# Patient Record
Sex: Female | Born: 1965
Health system: Southern US, Community
[De-identification: ages and names within clinical notes are randomized; demographics above are authoritative.]

## PROBLEM LIST (undated history)

## (undated) DIAGNOSIS — J45909 Unspecified asthma, uncomplicated: Secondary | ICD-10-CM

## (undated) DIAGNOSIS — E785 Hyperlipidemia, unspecified: Secondary | ICD-10-CM

## (undated) HISTORY — DX: Hyperlipidemia, unspecified: E78.5

## (undated) HISTORY — DX: Unspecified asthma, uncomplicated: J45.909

## (undated) HISTORY — PX: TONSILLECTOMY: SUR1361

## (undated) HISTORY — PX: CHOLECYSTECTOMY: SHX55

---

## 2015-07-20 ENCOUNTER — Other Ambulatory Visit: Payer: Self-pay | Admitting: Medical

## 2015-07-20 DIAGNOSIS — M7121 Synovial cyst of popliteal space [Baker], right knee: Secondary | ICD-10-CM

## 2015-07-23 ENCOUNTER — Ambulatory Visit
Admission: RE | Admit: 2015-07-23 | Discharge: 2015-07-23 | Disposition: A | Discharge status: Home / Self Care | Payer: BLUE CROSS/BLUE SHIELD | Source: Ambulatory Visit | Attending: Medical | Admitting: Medical

## 2015-07-23 DIAGNOSIS — M7989 Other specified soft tissue disorders: Secondary | ICD-10-CM | POA: Diagnosis not present

## 2015-07-23 DIAGNOSIS — M7121 Synovial cyst of popliteal space [Baker], right knee: Secondary | ICD-10-CM

## 2015-07-23 DIAGNOSIS — M25561 Pain in right knee: Secondary | ICD-10-CM | POA: Diagnosis present

## 2015-07-24 ENCOUNTER — Other Ambulatory Visit: Payer: Self-pay | Admitting: Medical

## 2015-07-24 ENCOUNTER — Ambulatory Visit
Admission: RE | Admit: 2015-07-24 | Discharge: 2015-07-24 | Disposition: A | Discharge status: Home / Self Care | Payer: BLUE CROSS/BLUE SHIELD | Source: Ambulatory Visit | Attending: Medical | Admitting: Medical

## 2015-07-24 DIAGNOSIS — M25561 Pain in right knee: Secondary | ICD-10-CM | POA: Insufficient documentation

## 2015-07-24 DIAGNOSIS — M1711 Unilateral primary osteoarthritis, right knee: Secondary | ICD-10-CM | POA: Diagnosis not present

## 2017-01-01 ENCOUNTER — Encounter: Payer: Self-pay | Admitting: Medical

## 2017-01-01 ENCOUNTER — Ambulatory Visit: Payer: Self-pay | Admitting: Medical

## 2017-01-01 VITALS — BP 128/76 | HR 101 | Temp 98.2°F | Resp 18 | Ht 70.0 in | Wt 301.0 lb

## 2017-01-01 DIAGNOSIS — J019 Acute sinusitis, unspecified: Secondary | ICD-10-CM

## 2017-01-01 DIAGNOSIS — J029 Acute pharyngitis, unspecified: Secondary | ICD-10-CM

## 2017-01-01 MED ORDER — AMOXICILLIN-POT CLAVULANATE 875-125 MG PO TABS: 1.0000 | ORAL_TABLET | Freq: Two times a day (BID) | ORAL | 0 refills | Status: DC

## 2017-01-01 NOTE — Patient Instructions (Addendum)
Sinusitis, Adult Sinusitis is soreness and inflammation of your sinuses. Sinuses are hollow spaces in the bones around your face. They are located:  Around your eyes.  In the middle of your forehead.  Behind your nose.  In your cheekbones.  Your sinuses and nasal passages are lined with a stringy fluid (mucus). Mucus normally drains out of your sinuses. When your nasal tissues get inflamed or swollen, the mucus can get trapped or blocked so air cannot flow through your sinuses. This lets bacteria, viruses, and funguses grow, and that leads to infection. Follow these instructions at home: Medicines  Take, use, or apply over-the-counter and prescription medicines only as told by your doctor. These may include nasal sprays.  If you were prescribed an antibiotic medicine, take it as told by your doctor. Do not stop taking the antibiotic even if you start to feel better. Hydrate and Humidify  Drink enough water to keep your pee (urine) clear or pale yellow.  Use a cool mist humidifier to keep the humidity level in your home above 50%.  Breathe in steam for 10-15 minutes, 3-4 times a day or as told by your doctor. You can do this in the bathroom while a hot shower is running.  Try not to spend time in cool or dry air. Rest  Rest as much as possible.  Sleep with your head raised (elevated).  Make sure to get enough sleep each night. General instructions  Put a warm, moist washcloth on your face 3-4 times a day or as told by your doctor. This will help with discomfort.  Wash your hands often with soap and water. If there is no soap and water, use hand sanitizer.  Do not smoke. Avoid being around people who are smoking (secondhand smoke).  Keep all follow-up visits as told by your doctor. This is important. Contact a doctor if:  You have a fever.  Your symptoms get worse.  Your symptoms do not get better within 10 days. Get help right away if:  You have a very bad  headache.  You cannot stop throwing up (vomiting).  You have pain or swelling around your face or eyes.  You have trouble seeing.  You feel confused.  Your neck is stiff.  You have trouble breathing. This information is not intended to replace advice given to you by your health care provider. Make sure you discuss any questions you have with your health care provider. Document Released: 12/10/2007 Document Revised: 02/17/2016 Document Reviewed: 04/18/2015 Elsevier Interactive Patient Education  2018 [employer]. Allergic Rhinitis Allergic rhinitis is when the mucous membranes in the nose respond to allergens. Allergens are particles in the air that cause your body to have an allergic reaction. This causes you to release allergic antibodies. Through a chain of events, these eventually cause you to release histamine into the blood stream. Although meant to protect the body, it is this release of histamine that causes your discomfort, such as frequent sneezing, congestion, and an itchy, runny nose. What are the causes? Seasonal allergic rhinitis (hay fever) is caused by pollen allergens that may come from grasses, trees, and weeds. Year-round allergic rhinitis (perennial allergic rhinitis) is caused by allergens such as house dust mites, pet dander, and mold spores. What are the signs or symptoms?  Nasal stuffiness (congestion).  Itchy, runny nose with sneezing and tearing of the eyes. How is this diagnosed? Your health care provider can help you determine the allergen or allergens that trigger your symptoms. If  you and your health care provider are unable to determine the allergen, skin or blood testing may be used. Your health care provider will diagnose your condition after taking your health history and performing a physical exam. Your health care provider may assess you for other related conditions, such as asthma, pink eye, or an ear infection. How is this treated? Allergic rhinitis  does not have a cure, but it can be controlled by:  Medicines that block allergy symptoms. These may include allergy shots, nasal sprays, and oral antihistamines.  Avoiding the allergen.  Hay fever may often be treated with antihistamines in pill or nasal spray forms. Antihistamines block the effects of histamine. There are over-the-counter medicines that may help with nasal congestion and swelling around the eyes. Check with your health care provider before taking or giving this medicine. If avoiding the allergen or the medicine prescribed do not work, there are many new medicines your health care provider can prescribe. Stronger medicine may be used if initial measures are ineffective. Desensitizing injections can be used if medicine and avoidance does not work. Desensitization is when a patient is given ongoing shots until the body becomes less sensitive to the allergen. Make sure you follow up with your health care provider if problems continue. Follow these instructions at home: It is not possible to completely avoid allergens, but you can reduce your symptoms by taking steps to limit your exposure to them. It helps to know exactly what you are allergic to so that you can avoid your specific triggers. Contact a health care provider if:  You have a fever.  You develop a cough that does not stop easily (persistent).  You have shortness of breath.  You start wheezing.  Symptoms interfere with normal daily activities. This information is not intended to replace advice given to you by your health care provider. Make sure you discuss any questions you have with your health care provider. Document Released: 03/18/2001 Document Revised: 02/22/2016 Document Reviewed: 02/28/2013 Elsevier Interactive Patient Education  2017 [employer]

## 2017-01-01 NOTE — Progress Notes (Addendum)
Patient calls about  2:30 pm c/o, pharyngitis that continues since last week.  Not worse nor better on antibiotics.  Would like to try some Prednisone to see if pharyngitis will decrease. Felt fatigued all weekend and spent most of the weekend in bed. Prefers Development worker, community. E prescribed  Prednisone 10mg  take  6 tablets by mouth today then 5 tablets tomorrow. Then one tablet less each day thereafter. To take with food.  #21 no refills.  Return to clinic in  3-5 days if not improving. Tried to send to PCP but unable to do so per in basket message.  Subjective:    Patient ID: Debra Gonzalez, female    DOB: [birth year], 51 y.o.   MRN: [id number]  HPI  Patient is a 51 year old  female who reports complaint of sore throat last two weeks sore throat. Increased fatigue.Denies difficulty swallowing. Denies fever, chills, nausea or vomiting. Denies any recent exposures. Denies any difficulty swallowing.  Taking Motrin over the counter as well as Catering manager. Chloraseptic spray. History of Asthma. She does report increased fatigue and sinus pressure for past 8 days. History of Sinusitis(. Denies any recent antibiotics.     Review of Systems  Constitutional: Positive for fatigue. Negative for appetite change, chills and fever.  HENT: Positive for postnasal drip, sinus pressure and sore throat. Negative for congestion, drooling, ear discharge, ear pain, sinus pain, tinnitus and trouble swallowing.   Eyes: Positive for itching. Negative for pain, discharge and redness.  Respiratory: Positive for shortness of breath (with walking outside/ inhaler improves- relieves). Negative for cough, chest tightness and wheezing.   Cardiovascular: Negative for chest pain, palpitations and leg swelling.  Gastrointestinal: Negative for abdominal pain, constipation, diarrhea and nausea.  Endocrine: Negative for polydipsia, polyphagia and polyuria.  Genitourinary: Negative for difficulty urinating and dysuria.   Musculoskeletal: Negative for arthralgias and myalgias (mild x 3 days ).  Skin: Negative for rash and wound.  Neurological: Positive for headaches (6/10 intermittent no more than one a day and is per patinet "sinus like pressure " ). Negative for dizziness, light-headedness and numbness.  Hematological: Negative for adenopathy. Does not bruise/bleed easily.  Psychiatric/Behavioral: Negative for agitation and confusion.       Objective:   Physical Exam  Constitutional: She is oriented to person, place, and time. She appears well-developed and well-nourished.  Non-toxic appearance. She does not have a sickly appearance. She does not appear ill. No distress.  HENT:  Head: Normocephalic and atraumatic.  Right Ear: Hearing, tympanic membrane, external ear and ear canal normal.  Left Ear: Hearing, tympanic membrane, external ear and ear canal normal.  Nose: Mucosal edema (Post nasal drip visualized ) and rhinorrhea present. Right sinus exhibits maxillary sinus tenderness. Right sinus exhibits no frontal sinus tenderness. Left sinus exhibits maxillary sinus tenderness. Left sinus exhibits no frontal sinus tenderness.  Mouth/Throat: Uvula is midline and mucous membranes are normal. Posterior oropharyngeal erythema present. No oropharyngeal exudate, posterior oropharyngeal edema or tonsillar abscesses.  Maxillary tenderness with palpation.   Eyes: Conjunctivae, EOM and lids are normal. Pupils are equal, round, and reactive to light.  Neck: Trachea normal, normal range of motion and full passive range of motion without pain. Neck supple. Normal carotid pulses and no JVD present. Carotid bruit is not present. No neck rigidity.  Cardiovascular: Normal rate, regular rhythm, S1 normal, S2 normal and normal heart sounds.   Pulmonary/Chest: Effort normal and breath sounds normal.  Lymphadenopathy:       Head (right  side): Submental adenopathy present. No submandibular, no tonsillar, no preauricular, no  posterior auricular and no occipital adenopathy present.       Head (left side): Submental adenopathy present. No submandibular, no tonsillar, no preauricular, no posterior auricular and no occipital adenopathy present.    She has no cervical adenopathy.  Neurological: She is alert and oriented to person, place, and time.  Sitting upright on exam table, engaging in conversation appropriately.  Skin: Skin is warm, dry and intact. She is not diaphoretic.  Psychiatric: She has a normal mood and affect. Her speech is normal and behavior is normal. Judgment and thought content normal. Cognition and memory are normal.  Nursing note and vitals reviewed.         Assessment & Plan:  1. Sinusitis - Augmentin prescribed as below.  Return to clinic if no improvement within 72 hours and return to clinic or urgent care if symptoms change or  worsen at anytime.   Discussed over the counter nasal saline rinse and can take ibuprofen per packaged instructions.   Meds ordered this encounter  Medications  . cetirizine (ZYRTEC) 10 MG tablet    Sig: Take 10 mg by mouth daily.  Marland Kitchen. albuterol (PROVENTIL HFA;VENTOLIN HFA) 108 (90 Base) MCG/ACT inhaler    Sig: Inhale 2 puffs into the lungs every 6 (six) hours as needed for wheezing or shortness of breath.  Marland Kitchen. amoxicillin-clavulanate (AUGMENTIN) 875-125 MG tablet    Sig: Take 1 tablet by mouth 2 (two) times daily.    Dispense:  20 tablet    Refill:  0   Visit made with Allean FoundHeather Ratcliff PA

## 2017-01-05 MED ORDER — PREDNISONE 10 MG (21) PO TBPK: ORAL_TABLET | ORAL | 0 refills | Status: DC

## 2017-01-05 NOTE — Addendum Note (Signed)
Addended by: Lavaun Greenfield, Herbert SetaHEATHER R on: 01/05/2017 02:49 PM   Modules accepted: Orders

## 2017-10-05 ENCOUNTER — Ambulatory Visit: Payer: Self-pay | Admitting: Medical

## 2017-10-05 VITALS — BP 137/69 | HR 87 | Temp 98.9°F | Resp 18 | Ht 70.0 in | Wt 307.8 lb

## 2017-10-05 DIAGNOSIS — T63301A Toxic effect of unspecified spider venom, accidental (unintentional), initial encounter: Secondary | ICD-10-CM

## 2017-10-05 MED ORDER — CEPHALEXIN 500 MG PO CAPS: 500.0000 mg | ORAL_CAPSULE | Freq: Three times a day (TID) | ORAL | 0 refills | Status: DC

## 2017-10-05 NOTE — Progress Notes (Signed)
   Subjective:    Patient ID: Debra Gonzalez, female    DOB: Dec 27, 1965, [age] y.o.   MRN: [id number]  HPI  52 yo in non acute distress. Thinks she got bit by a spider on Friday on left anterrior lower leg.Manson Passey.  Brown spider the size of a  nickle and furry.Itching occasionally , takes Zyrtec and phenylephrine once a day for allergy symptoms.  Review of Systems  Constitutional: Positive for chills. Negative for fever.  HENT: Positive for congestion and sore throat (feels full). Negative for ear pain.   Eyes: Negative for discharge and itching.  Respiratory: Negative for cough and shortness of breath.   Cardiovascular: Negative for chest pain.  Gastrointestinal: Positive for abdominal pain (with diarrhea resolved after diarrhea ), diarrhea and nausea. Negative for vomiting.  Genitourinary: Negative for dysuria.  Musculoskeletal: Positive for arthralgias (knee and elbows) and myalgias.  Skin: Positive for color change (redness circular about 1/2 cm, no discharged you itching it Saturday morning with clear fluid.).  Allergic/Immunologic: Positive for environmental allergies and food allergies (spiniach and too much cinnamon).  Neurological: Negative for dizziness, syncope and light-headedness.  Hematological: Negative for adenopathy.  Psychiatric/Behavioral: Positive for decreased concentration. Negative for behavioral problems, self-injury and suicidal ideas.  ate a chicken sandwich at lung then went to the bathroom with diarrhea 2 times, has been  2 times a day.     Objective:   Physical Exam  Constitutional: She is oriented to person, place, and time. She appears well-developed and well-nourished.  HENT:  Head: Normocephalic and atraumatic.  Eyes: Pupils are equal, round, and reactive to light. Conjunctivae and EOM are normal.  Neck: Normal range of motion. Neck supple.  Cardiovascular: Normal rate, regular rhythm and normal heart sounds.  Pulmonary/Chest: Effort normal and breath sounds  normal.  Musculoskeletal: Normal range of motion. She exhibits no edema, tenderness or deformity.  Neurological: She is alert and oriented to person, place, and time.  Skin: Skin is warm and dry. There is erythema (1/2 cm  left lower leg anteriorly).  Nursing note and vitals reviewed.    No inguinal adenopathy. Circular mild erythema on left lower leg anteriorly, no discharge.     Assessment & Plan:  Spider bite skin infection. Clean twice daily with dilute soap and water and neosporin to the site and bandage. Dressed wound for patient today. If pain OTC Motrin or Tylenlol take as directed.  Return in 2 days if not improving. Meds ordered this encounter  Medications  . cephALEXin (KEFLEX) 500 MG capsule    Sig: Take 1 capsule (500 mg total) by mouth 3 (three) times daily.    Dispense:  21 capsule    Refill:  0  patient verbalizes understanding and has no questions at discharge.

## 2017-10-05 NOTE — Patient Instructions (Signed)
Clean twice daily with dilute soap and water and neosporin to the site and a bandage to the area.    Spider Bite Spider bites are not common. Most spider bites do not cause serious problems. There are only a few types of spider bites that can cause serious health problems. Follow these instructions at home: Medicine  Take or apply over-the-counter and prescription medicines only as told by your doctor.  If you were given an antibiotic medicine, take or apply it as told by your doctor. Do not stop using the antibiotic even if your condition improves. General instructions  Do not scratch the bite area.  Keep the bite area clean and dry. Wash the bite area with soap and water every day as told by your doctor.  If directed, apply ice to the bite area. ? Put ice in a plastic bag. ? Place a towel between your skin and the bag. ? Leave the ice on for 20 minutes, 2-3 times per day.  Raise (elevate) the affected area above the level of your heart while you are sitting or lying down, if this is possible.  Keep all follow-up visits as told by your doctor. This is important. Contact a doctor if:  Your bite does not get better after 3 days.  Your bite turns black or purple.  Near the bite, you have: ? Redness. ? Swelling (inflammation). ? Pain that is getting worse. Get help right away if:  You get shortness of breath or chest pain.  You have fluid, blood, or pus coming from the bite area.  You have muscle cramps or painful muscle spasms.  You have stomach (abdominal) pain.  You feel sick to your stomach (nauseous) or you throw up (vomit).  You feel more tired or sleepy than you normally do. This information is not intended to replace advice given to you by your health care provider. Make sure you discuss any questions you have with your health care provider. Document Released: 07/26/2010 Document Revised: 02/18/2016 Document Reviewed: 11/08/2014 Elsevier Interactive Patient  Education  [employer]2018 [employer] Inc[employer]

## 2018-04-05 ENCOUNTER — Ambulatory Visit: Payer: Self-pay | Admitting: Medical

## 2018-04-05 ENCOUNTER — Encounter: Payer: Self-pay | Admitting: Medical

## 2018-04-05 VITALS — BP 133/86 | HR 82 | Temp 98.5°F | Resp 18 | Wt 297.6 lb

## 2018-04-05 DIAGNOSIS — W57XXXA Bitten or stung by nonvenomous insect and other nonvenomous arthropods, initial encounter: Secondary | ICD-10-CM

## 2018-04-05 DIAGNOSIS — L089 Local infection of the skin and subcutaneous tissue, unspecified: Secondary | ICD-10-CM

## 2018-04-05 MED ORDER — CEPHALEXIN 500 MG PO CAPS: 500.0000 mg | ORAL_CAPSULE | Freq: Three times a day (TID) | ORAL | 0 refills | Status: DC

## 2018-04-05 NOTE — Patient Instructions (Signed)
Insect Bite, Adult An insect bite can make your skin red, itchy, and swollen. Some insects can spread disease to people with a bite. However, most insect bites do not lead to disease, and most are not serious. Follow these instructions at home: Bite area care  Do not scratch the bite area.  Keep the bite area clean and dry.  Wash the bite area every day with soap and water as told by your doctor.  Check the bite area every day for signs of infection. Check for: ? More redness, swelling, or pain. ? Fluid or blood. ? Warmth. ? Pus. Managing pain, itching, and swelling  You may put any of these on the bite area as told by your doctor: ? A baking soda paste. ? Cortisone cream. ? Calamine lotion.  If directed, put ice on the bite area. ? Put ice in a plastic bag. ? Place a towel between your skin and the bag. ? Leave the ice on for 20 minutes, 2-3 times a day. Medicines  Take medicines or put medicines on your skin only as told by your doctor.  If you were prescribed an antibiotic medicine, use it as told by your doctor. Do not stop using the antibiotic even if your condition improves. General instructions  Keep all follow-up visits as told by your doctor. This is important. How is this prevented? To help you have a lower risk of insect bites:  When you are outside, wear clothing that covers your arms and legs.  Use insect repellent. The best insect repellents have: ? An active ingredient of DEET, picaridin, oil of lemon eucalyptus (OLE), or IR3535. ? Higher amounts of DEET or another active ingredient than other repellents have.  If your home windows do not have screens, think about putting some in.  Contact a doctor if:  You have more redness, swelling, or pain in the bite area.  You have fluid, blood, or pus coming from the bite area.  The bite area feels warm.  You have a fever. Get help right away if:  You have joint pain.  You have a rash.  You have  shortness of breath.  You feel more tired or sleepy than you normally do.  You have neck pain.  You have a headache.  You feel weaker than you normally do.  You have chest pain.  You have pain in your belly.  You feel sick to your stomach (nauseous) or you throw up (vomit). Summary  An insect bite can make your skin red, itchy, and swollen.  Do not scratch the bite area, and keep it clean and dry.  Ice can help with pain and itching from the bite. This information is not intended to replace advice given to you by your health care provider. Make sure you discuss any questions you have with your health care provider. Document Released: 06/20/2000 Document Revised: 01/24/2016 Document Reviewed: 11/08/2014 Elsevier Interactive Patient Education  2018 [employer] Inc.  

## 2018-04-05 NOTE — Progress Notes (Signed)
   Subjective:    Patient ID: Debra Gonzalez, female    DOB: 1966/06/05, 52 y.o.   MRN: [id number]  HPI 52 yo female in non acute distress.  Presents with complaints of bug bite, left lower leg on the shin. Noticed area Friday night , itchy but not painful.  Her office staff felt she needed to come in and get checked.  Has washed it with soap and water only. No fever or chills no myalgias. Patient states she had a Pallmetto bug in her house and he went behind a wall and it could of bite her, and she states she walked through the grass the other day and may have gotten bit then, recalls no specific instance.  Blood pressure 133/86, pulse 82, temperature 98.5 F (36.9 C), temperature source Tympanic, resp. rate 18, weight 297 lb 9.6 oz (135 kg), SpO2 99 %. Review of Systems  Constitutional: Negative for chills and fever.  HENT: Positive for congestion (thinks it is allergies) and sore throat. Negative for ear pain.   Eyes: Negative for discharge and itching.  Respiratory: Negative for cough and shortness of breath.   Cardiovascular: Negative for chest pain.  Gastrointestinal: Negative for abdominal pain.  Genitourinary: Negative for dysuria.  Musculoskeletal: Negative for myalgias.  Skin: Negative for rash.  Allergic/Immunologic: Positive for environmental allergies.  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 none today.), syncope and light-headedness.  Hematological: Negative for adenopathy.  Psychiatric/Behavioral: Negative for behavioral problems, self-injury and suicidal ideas.       Objective:   Physical Exam  Constitutional: She is oriented to person, place, and time. She appears well-developed and well-nourished.  HENT:  Head: Normocephalic and atraumatic.  Mouth/Throat: Oropharynx is clear and moist and mucous membranes are normal. Uvula swelling (mild) present. Tonsils are 1+ on the right.  Tonsils are 1+ on the left.  Pulmonary/Chest: No respiratory distress.  Neurological: She is alert and oriented to person, place, and time.  Skin: Skin is warm and dry. Capillary refill takes less than 2 seconds. No rash noted. There is erythema. No pallor.  Psychiatric: She has a normal mood and affect. Her behavior is normal. Judgment and thought content normal.  Nursing note and vitals reviewed.  2+ PT pulse   4cm circular ( area marked for patient to watch) erythema with tiny puncture area where bug bit her located on the left lower shin area. Mild swelling noted to area. No groin adenopathy. Assessment & Plan:  Bug bite Skin infection Meds ordered this encounter  Medications  . cephALEXin (KEFLEX) 500 MG capsule    Sig: Take 1 capsule (500 mg total) by mouth 3 (three) times daily.    Dispense:  30 capsule    Refill:  0  wash BID with dilute soap and water neosporin to site and a dressing to the area. Dressed in clinic with neosporin. If fever , chills or worsening to return to the clinic. Return in 3-5 days if not improving.  Use warm /cool compresses to the site. Take OTC Motrin as needed for pain and swelling , take as directed on the package. Patient verbalizes understanding and has no questions at discharge.

## 2018-04-06 ENCOUNTER — Ambulatory Visit: Payer: Self-pay | Admitting: Medical

## 2018-12-27 ENCOUNTER — Telehealth: Payer: Self-pay | Admitting: *Deleted

## 2018-12-27 NOTE — Telephone Encounter (Signed)
Debra Gonzalez called with c/o "fluid in both ears, makes them feel itchy for about a week and a half". When asked she states she is taking her Zyrtec daily and using Flonase 2 sprays every morning. After consult with H.Ratcliffe PA-C, and confirming pt does not have hypertension, advised pt to take Zyrtec-D for the next 2 weeks to help dry up the fluid in her ears and continue use of Flonase daily. Further advised if symptoms do not improve or get worse to call her PCP. Ms. Debra Gonzalez understanding of topics discussed and has no further needs or concerns at this time.

## 2019-03-16 DIAGNOSIS — D485 Neoplasm of uncertain behavior of skin: Secondary | ICD-10-CM | POA: Diagnosis not present

## 2019-03-16 DIAGNOSIS — B353 Tinea pedis: Secondary | ICD-10-CM | POA: Diagnosis not present

## 2019-03-16 DIAGNOSIS — L821 Other seborrheic keratosis: Secondary | ICD-10-CM | POA: Diagnosis not present

## 2019-04-05 DIAGNOSIS — J060 Acute laryngopharyngitis: Secondary | ICD-10-CM | POA: Diagnosis not present

## 2019-04-14 DIAGNOSIS — Z20828 Contact with and (suspected) exposure to other viral communicable diseases: Secondary | ICD-10-CM | POA: Diagnosis not present

## 2019-05-16 DIAGNOSIS — L814 Other melanin hyperpigmentation: Secondary | ICD-10-CM | POA: Diagnosis not present

## 2019-05-16 DIAGNOSIS — L719 Rosacea, unspecified: Secondary | ICD-10-CM | POA: Diagnosis not present

## 2019-05-16 DIAGNOSIS — L821 Other seborrheic keratosis: Secondary | ICD-10-CM | POA: Diagnosis not present

## 2019-05-16 DIAGNOSIS — D179 Benign lipomatous neoplasm, unspecified: Secondary | ICD-10-CM | POA: Diagnosis not present

## 2019-08-15 DIAGNOSIS — J45901 Unspecified asthma with (acute) exacerbation: Secondary | ICD-10-CM | POA: Diagnosis not present

## 2019-08-15 DIAGNOSIS — J301 Allergic rhinitis due to pollen: Secondary | ICD-10-CM | POA: Diagnosis not present

## 2019-08-15 DIAGNOSIS — J0100 Acute maxillary sinusitis, unspecified: Secondary | ICD-10-CM | POA: Diagnosis not present

## 2019-11-04 DIAGNOSIS — J069 Acute upper respiratory infection, unspecified: Secondary | ICD-10-CM | POA: Diagnosis not present

## 2019-11-04 DIAGNOSIS — Z20822 Contact with and (suspected) exposure to covid-19: Secondary | ICD-10-CM | POA: Diagnosis not present

## 2019-11-04 DIAGNOSIS — J029 Acute pharyngitis, unspecified: Secondary | ICD-10-CM | POA: Diagnosis not present

## 2019-11-07 ENCOUNTER — Other Ambulatory Visit: Payer: Self-pay

## 2019-11-07 ENCOUNTER — Ambulatory Visit
Admission: EM | Admit: 2019-11-07 | Discharge: 2019-11-07 | Disposition: A | Discharge status: Home / Self Care | Payer: BC Managed Care – PPO

## 2019-11-07 DIAGNOSIS — J45901 Unspecified asthma with (acute) exacerbation: Secondary | ICD-10-CM | POA: Diagnosis not present

## 2019-11-07 DIAGNOSIS — Z03818 Encounter for observation for suspected exposure to other biological agents ruled out: Secondary | ICD-10-CM

## 2019-11-07 HISTORY — DX: Unspecified asthma, uncomplicated: J45.909

## 2019-11-07 LAB — POC SARS CORONAVIRUS 2 AG -  ED: SARS Coronavirus 2 Ag: NEGATIVE

## 2019-11-07 MED ORDER — AZITHROMYCIN 250 MG PO TABS: 250.0000 mg | ORAL_TABLET | Freq: Every day | ORAL | 0 refills | Status: DC

## 2019-11-07 MED ORDER — PREDNISONE 10 MG PO TABS
40.0000 mg | ORAL_TABLET | Freq: Every day | ORAL | 0 refills | Status: AC
Start: 2019-11-07 — End: 2019-11-12

## 2019-11-07 NOTE — Discharge Instructions (Signed)
Use your albuterol inhaler as directed.  Take the Zithromax and prednisone as directed.    Follow up with your PCP if your symptoms are not improving.    Your rapid COVID test is negative; the send-out test is pending.  You should self quarantine until your test result is back and is negative.    Go to the emergency department if you develop high fever, shortness of breath, severe diarrhea, or other concerning symptoms.

## 2019-11-07 NOTE — ED Provider Notes (Signed)
Debra Gonzalez    CSN: [id number] Arrival date & time: 11/07/19  1035      History   Chief Complaint Chief Complaint  Patient presents with  . Hoarse    HPI Debra Gonzalez is a 54 y.o. female.   Patient presents with 5-day history of fever, nonproductive cough, nasal congestion, hoarse voice.  T-max 100.8.  She had a sore throat previously but this has resolved.  [facility]; had a negative rapid COVID and rapid strep; she was treated with Tessalon Perles.  Additionally she has been using her albuterol inhaler at home for treatment; she has Reactive Airway Disease.  She denies rash, shortness of breath, vomiting, diarrhea, or other symptoms.  The history is provided by the patient.    Past Medical History:  Diagnosis Date  . Reactive airway disease     There are no problems to display for this patient.   Past Surgical History:  Procedure Laterality Date  . CHOLECYSTECTOMY      OB History   No obstetric history on file.      Home Medications    Prior to Admission medications   Medication Sig Start Date End Date Taking? Authorizing Provider  albuterol (PROVENTIL HFA;VENTOLIN HFA) 108 (90 Base) MCG/ACT inhaler Inhale 2 puffs into the lungs every 6 (six) hours as needed for wheezing or shortness of breath.   Yes [provider]  cetirizine (ZYRTEC) 10 MG tablet Take 10 mg by mouth daily.   Yes [provider]  fluticasone (FLOVENT HFA) 110 MCG/ACT inhaler Inhale into the lungs 2 (two) times daily.   Yes [provider]  azithromycin (ZITHROMAX) 250 MG tablet Take 1 tablet (250 mg total) by mouth daily. Take first 2 tablets together, then 1 every day until finished. 11/07/19   Mickie Bail, NP  cephALEXin (KEFLEX) 500 MG capsule Take 1 capsule (500 mg total) by mouth 3 (three) times daily. 04/05/18   Ratcliffe, Heather R, PA-C  predniSONE (DELTASONE) 10 MG tablet Take 4 tablets (40 mg total) by mouth daily for 5  days. 11/07/19 11/12/19  Mickie Bail, NP    Family History Family History  Problem Relation Age of Onset  . Mitral valve prolapse Mother   . Melanoma Father     Social History Social History   Tobacco Use  . Smoking status: Never Smoker  . Smokeless tobacco: Never Used  Substance Use Topics  . Alcohol use: Yes    Alcohol/week: 1.0 standard drinks    Types: 1 Glasses of wine per week    Comment: weekly  . Drug use: No     Allergies   Sulfa antibiotics, Cinnamon, Codeine, Spinach, and Wheat bran   Review of Systems Review of Systems  Constitutional: Positive for fever. Negative for chills.  HENT: Positive for congestion and sore throat. Negative for ear pain and trouble swallowing.   Eyes: Negative for pain and visual disturbance.  Respiratory: Positive for cough. Negative for shortness of breath.   Cardiovascular: Negative for chest pain and palpitations.  Gastrointestinal: Negative for abdominal pain, diarrhea, nausea and vomiting.  Genitourinary: Negative for dysuria and hematuria.  Musculoskeletal: Negative for arthralgias and back pain.  Skin: Negative for color change and rash.  Neurological: Negative for seizures and syncope.  All other systems reviewed and are negative.    Physical Exam Triage Vital Signs ED Triage Vitals  Enc Vitals Group     BP  Pulse      Resp      Temp      Temp src      SpO2      Weight      Height      Head Circumference      Peak Flow      Pain Score      Pain Loc      Pain Edu?      Excl. in [place]?    No data found.  Updated Vital Signs BP 133/79 (BP Location: Left Arm)   Pulse (!) 114   Temp 99.5 F (37.5 C) (Oral)   Resp 18   Ht 5\' 11"  (1.803 m)   Wt (!) 320 lb (145.2 kg)   LMP  (LMP Unknown)   SpO2 95%   BMI 44.63 kg/m   Visual Acuity Right Eye Distance:   Left Eye Distance:   Bilateral Distance:    Right Eye Near:   Left Eye Near:    Bilateral Near:     Physical Exam Vitals and nursing note  reviewed.  Constitutional:      General: She is not in acute distress.    Appearance: She is well-developed. She is obese.  HENT:     Head: Normocephalic and atraumatic.     Right Ear: Tympanic membrane normal.     Left Ear: Tympanic membrane normal.     Nose: Congestion present.     Mouth/Throat:     Mouth: Mucous membranes are moist.     Pharynx: Oropharynx is clear.  Eyes:     Conjunctiva/sclera: Conjunctivae normal.  Cardiovascular:     Rate and Rhythm: Normal rate and regular rhythm.     Heart sounds: No murmur.  Pulmonary:     Effort: Pulmonary effort is normal. No respiratory distress.     Breath sounds: Wheezing and rhonchi present.     Comments: Few scattered rhonchi and wheezes.  No respiratory distress.  Abdominal:     Palpations: Abdomen is soft.     Tenderness: There is no abdominal tenderness. There is no guarding or rebound.  Musculoskeletal:     Cervical back: Neck supple.  Skin:    General: Skin is warm and dry.     Findings: No rash.  Neurological:     General: No focal deficit present.     Mental Status: She is alert and oriented to person, place, and time.  Psychiatric:        Mood and Affect: Mood normal.        Behavior: Behavior normal.      UC Treatments / Results  Labs (all labs ordered are listed, but only abnormal results are displayed) Labs Reviewed  NOVEL CORONAVIRUS, NAA  POC SARS CORONAVIRUS 2 AG -  ED    EKG   Radiology No results found.  Procedures Procedures (including critical care time)  Medications Ordered in UC Medications - No data to display  Initial Impression / Assessment and Plan / UC Course  I have reviewed the triage vital signs and the nursing notes.  Pertinent labs & imaging results that were available during my care of the patient were reviewed by me and considered in my medical decision making (see chart for details).   Acute exacerbation of reactive airway disease.  Treating with continued use of  albuterol inhaler.  Also Zithromax and prednisone.  Instructed patient to follow-up with her PCP if her symptoms or not improving.  POC COVID negative;  PCR pending.  Instructed patient to self quarantine until the test result is back and to take Tylenol as needed for fever/discomfort.  Instructed patient to go to the emergency department if she develops high fever, shortness of breath, severe diarrhea, or other concerning symptoms.  Patient agrees with plan of care.    Final Clinical Impressions(s) / UC Diagnoses   Final diagnoses:  Reactive airway disease with acute exacerbation, unspecified asthma severity, unspecified whether persistent     Discharge Instructions     Use your albuterol inhaler as directed.  Take the Zithromax and prednisone as directed.    Follow up with your PCP if your symptoms are not improving.    Your rapid COVID test is negative; the send-out test is pending.  You should self quarantine until your test result is back and is negative.    Go to the emergency department if you develop high fever, shortness of breath, severe diarrhea, or other concerning symptoms.        ED Prescriptions    Medication Sig Dispense Auth. Provider   azithromycin (ZITHROMAX) 250 MG tablet Take 1 tablet (250 mg total) by mouth daily. Take first 2 tablets together, then 1 every day until finished. 6 tablet Mickie Bail, NP   predniSONE (DELTASONE) 10 MG tablet Take 4 tablets (40 mg total) by mouth daily for 5 days. 20 tablet Mickie Bail, NP     PDMP not reviewed this encounter.   Mickie Bail, NP 11/07/19 [phone]

## 2019-11-07 NOTE — ED Triage Notes (Addendum)
Patient complains of hoarseness, fever, cough and congestion. Patient states that she was seen on Friday at [facility] in [place]. States that was tested for Covid and Strep and both were negative. Patient states that she has continued to have fevers and feels like this has settled and moved in to her chest. Patient states that she was given Tessalon perles and has showed no improvement.

## 2019-11-09 LAB — NOVEL CORONAVIRUS, NAA: SARS-CoV-2, NAA: NOT DETECTED

## 2019-11-09 LAB — SARS-COV-2, NAA 2 DAY TAT

## 2019-11-14 ENCOUNTER — Telehealth: Payer: Self-pay | Admitting: Family Medicine

## 2019-11-14 NOTE — Telephone Encounter (Signed)
Patient was contacted advised to use otc Flonase and if not any better needs to come back and be evaluated again

## 2019-11-15 ENCOUNTER — Ambulatory Visit
Admission: EM | Admit: 2019-11-15 | Discharge: 2019-11-15 | Disposition: A | Discharge status: Home / Self Care | Payer: BC Managed Care – PPO

## 2019-11-15 ENCOUNTER — Encounter: Payer: Self-pay | Admitting: Emergency Medicine

## 2019-11-15 ENCOUNTER — Other Ambulatory Visit: Payer: Self-pay

## 2019-11-15 DIAGNOSIS — J45901 Unspecified asthma with (acute) exacerbation: Secondary | ICD-10-CM

## 2019-11-15 MED ORDER — PREDNISONE 10 MG PO TABS
40.0000 mg | ORAL_TABLET | Freq: Every day | ORAL | 0 refills | Status: AC
Start: 2019-11-15 — End: 2019-11-20

## 2019-11-15 NOTE — ED Provider Notes (Signed)
[name]    CSN: [id number] Arrival date & time: 11/15/19  5009      History   Chief Complaint Chief Complaint  Patient presents with  . Cough  . Shortness of Breath  . Nasal Congestion    HPI Debra Gonzalez is a 54 y.o. female.   Patient presents with ongoing nonproductive cough and shortness of breath.  She states she has been using her albuterol inhaler every 6 hours and has completed the course of Zithromax and prednisone previously prescribed.  She denies fever, chills, sore throat, vomiting, diarrhea, rash, or other symptoms.  [facility]; treated with Tessalon Perles; COVID and strep negative.  She was seen here on 11/07/2019; treated with Zithromax, prednisone, albuterol inhaler; COVID negative.  She contacted here via telephone yesterday and was instructed to use OTC Flonase or come for in-person evaluation.  The history is provided by the patient.    Past Medical History:  Diagnosis Date  . Reactive airway disease     There are no problems to display for this patient.   Past Surgical History:  Procedure Laterality Date  . CHOLECYSTECTOMY      OB History   No obstetric history on file.      Home Medications    Prior to Admission medications   Medication Sig Start Date End Date Taking? Authorizing Provider  albuterol (PROVENTIL HFA;VENTOLIN HFA) 108 (90 Base) MCG/ACT inhaler Inhale 2 puffs into the lungs every 6 (six) hours as needed for wheezing or shortness of breath.   Yes [provider]  benzonatate (TESSALON) 200 MG capsule Take 1 capsule by mouth at bedtime. 11/04/19  Yes [provider]  cetirizine (ZYRTEC) 10 MG tablet Take 10 mg by mouth daily.   Yes [provider]  fluticasone (FLONASE) 50 MCG/ACT nasal spray Place 2 sprays into both nostrils daily.   Yes [provider]  fluticasone (FLOVENT HFA) 110 MCG/ACT inhaler Inhale into the lungs 2 (two) times daily.   Yes  [provider]  azithromycin (ZITHROMAX) 250 MG tablet Take 1 tablet (250 mg total) by mouth daily. Take first 2 tablets together, then 1 every day until finished. 11/07/19   Mickie Bail, NP  cephALEXin (KEFLEX) 500 MG capsule Take 1 capsule (500 mg total) by mouth 3 (three) times daily. 04/05/18   Ratcliffe, Heather R, PA-C  predniSONE (DELTASONE) 10 MG tablet Take 4 tablets (40 mg total) by mouth daily for 5 days. 11/15/19 11/20/19  Mickie Bail, NP    Family History Family History  Problem Relation Age of Onset  . Mitral valve prolapse Mother   . Melanoma Father     Social History Social History   Tobacco Use  . Smoking status: Never Smoker  . Smokeless tobacco: Never Used  Substance Use Topics  . Alcohol use: Yes    Alcohol/week: 1.0 standard drinks    Types: 1 Glasses of wine per week    Comment: weekly  . Drug use: No     Allergies   Sulfa antibiotics, Cinnamon, Codeine, Spinach, and Wheat bran   Review of Systems Review of Systems  Constitutional: Negative for chills and fever.  HENT: Positive for congestion. Negative for ear pain and sore throat.   Eyes: Negative for pain and visual disturbance.  Respiratory: Positive for cough and shortness of breath.   Cardiovascular: Negative for chest pain and palpitations.  Gastrointestinal: Negative for abdominal pain, diarrhea, nausea and vomiting.  Genitourinary: Negative for dysuria and hematuria.  Musculoskeletal: Negative for arthralgias and back pain.  Skin: Negative for color change and rash.  Neurological: Negative for seizures and syncope.  All other systems reviewed and are negative.    Physical Exam Triage Vital Signs ED Triage Vitals  Enc Vitals Group     BP 11/15/19 0926 136/84     Pulse Rate 11/15/19 0926 98     Resp 11/15/19 0926 18     Temp 11/15/19 0926 99.1 F (37.3 C)     Temp Source 11/15/19 0926 Oral     SpO2 11/15/19 0926 97 %     Weight 11/15/19 0928 (!) 320 lb (145.2 kg)      Height 11/15/19 0928 5' 10.5" (1.791 m)     Head Circumference --      Peak Flow --      Pain Score 11/15/19 0927 7     Pain Loc --      Pain Edu? --      Excl. in [place]? --    No data found.  Updated Vital Signs BP 136/84 (BP Location: Right Arm)   Pulse 98   Temp 99.1 F (37.3 C) (Oral)   Resp 18   Ht 5' 10.5" (1.791 m)   Wt (!) 320 lb (145.2 kg)   LMP  (LMP Unknown)   SpO2 97%   BMI 45.27 kg/m   Visual Acuity Right Eye Distance:   Left Eye Distance:   Bilateral Distance:    Right Eye Near:   Left Eye Near:    Bilateral Near:     Physical Exam Vitals and nursing note reviewed.  Constitutional:      General: She is not in acute distress.    Appearance: She is well-developed. She is not ill-appearing.  HENT:     Head: Normocephalic and atraumatic.     Right Ear: Tympanic membrane normal.     Left Ear: Tympanic membrane normal.     Nose: Nose normal.     Mouth/Throat:     Mouth: Mucous membranes are moist.     Pharynx: Oropharynx is clear.  Eyes:     Conjunctiva/sclera: Conjunctivae normal.  Cardiovascular:     Rate and Rhythm: Normal rate and regular rhythm.     Heart sounds: No murmur.  Pulmonary:     Effort: Pulmonary effort is normal. No respiratory distress.     Breath sounds: Normal breath sounds. No wheezing or rhonchi.     Comments: Dry cough during exam.  No wheezes or rhonchi but breath sounds mildly tight when coughing.  Abdominal:     General: Bowel sounds are normal.     Palpations: Abdomen is soft.     Tenderness: There is no abdominal tenderness. There is no guarding or rebound.  Musculoskeletal:     Cervical back: Neck supple.  Skin:    General: Skin is warm and dry.     Findings: No rash.  Neurological:     General: No focal deficit present.     Mental Status: She is alert and oriented to person, place, and time.  Psychiatric:        Mood and Affect: Mood normal.        Behavior: Behavior normal.      UC Treatments / Results   Labs (all labs ordered are listed, but only abnormal results are displayed) Labs Reviewed - No data to display  EKG   Radiology No results found.  Procedures Procedures (  including critical care time)  Medications Ordered in UC Medications - No data to display  Initial Impression / Assessment and Plan / UC Course  I have reviewed the triage vital signs and the nursing notes.  Pertinent labs & imaging results that were available during my care of the patient were reviewed by me and considered in my medical decision making (see chart for details).   Acute exacerbation of reactive airway disease.  Treating with prednisone.  Instructed patient to continue using her albuterol inhaler.  Instructed her to call her PCP to schedule an appointment for recheck in 1 week.  Instructed her to go to the ED if she has acute shortness of breath or difficulty breathing.  Patient agrees to plan of care.      Final Clinical Impressions(s) / UC Diagnoses   Final diagnoses:  Reactive airway disease with acute exacerbation, unspecified asthma severity, unspecified whether persistent     Discharge Instructions     Continue to use your albuterol inhaler.  Take the prednisone as directed.    Call your primary care provider to schedule an appointment for a recheck in 1 week.    Go to the emergency department if you have acute shortness of breath or difficulty breathing.          ED Prescriptions    Medication Sig Dispense Auth. Provider   predniSONE (DELTASONE) 10 MG tablet Take 4 tablets (40 mg total) by mouth daily for 5 days. 20 tablet Sharion Balloon, NP     PDMP not reviewed this encounter.   Sharion Balloon, NP 11/15/19 1004

## 2019-11-15 NOTE — Discharge Instructions (Signed)
Continue to use your albuterol inhaler.  Take the prednisone as directed.    Call your primary care provider to schedule an appointment for a recheck in 1 week.    Go to the emergency department if you have acute shortness of breath or difficulty breathing.

## 2019-11-15 NOTE — ED Triage Notes (Signed)
Patient in today c/o continued cough, nasal congestion and sob x 10 days. Patient was seen for same on 11/07/19 at [facility].

## 2019-11-24 ENCOUNTER — Emergency Department: Payer: BC Managed Care – PPO

## 2019-11-24 ENCOUNTER — Encounter: Payer: Self-pay | Admitting: *Deleted

## 2019-11-24 ENCOUNTER — Other Ambulatory Visit: Payer: Self-pay

## 2019-11-24 DIAGNOSIS — J4521 Mild intermittent asthma with (acute) exacerbation: Secondary | ICD-10-CM | POA: Diagnosis not present

## 2019-11-24 DIAGNOSIS — R0602 Shortness of breath: Secondary | ICD-10-CM | POA: Diagnosis not present

## 2019-11-24 DIAGNOSIS — Z79899 Other long term (current) drug therapy: Secondary | ICD-10-CM | POA: Diagnosis not present

## 2019-11-24 DIAGNOSIS — R05 Cough: Secondary | ICD-10-CM | POA: Diagnosis not present

## 2019-11-24 LAB — BASIC METABOLIC PANEL
Anion gap: 12 (ref 5–15)
BUN: 14 mg/dL (ref 6–20)
CO2: 22 mmol/L (ref 22–32)
Calcium: 8.8 mg/dL — ABNORMAL LOW (ref 8.9–10.3)
Chloride: 104 mmol/L (ref 98–111)
Creatinine, Ser: 0.75 mg/dL (ref 0.44–1.00)
GFR calc Af Amer: >60 mL/min (ref >60)
GFR calc non Af Amer: >60 mL/min (ref >60)
Glucose, Bld: 116 mg/dL — ABNORMAL HIGH (ref 70–99)
Potassium: 4.1 mmol/L (ref 3.5–5.1)
Sodium: 138 mmol/L (ref 135–145)

## 2019-11-24 LAB — CBC
HCT: 40.7 % (ref 36.0–46.0)
Hemoglobin: 14.2 g/dL (ref 12.0–15.0)
MCH: 32.7 pg (ref 26.0–34.0)
MCHC: 34.9 g/dL (ref 30.0–36.0)
MCV: 93.8 fL (ref 80.0–100.0)
Platelets: 289 10*3/uL (ref 150–400)
RBC: 4.34 MIL/uL (ref 3.87–5.11)
RDW: 12.4 % (ref 11.5–15.5)
WBC: 7.7 10*3/uL (ref 4.0–10.5)
nRBC: 0 % (ref 0.0–0.2)

## 2019-11-24 LAB — TROPONIN I (HIGH SENSITIVITY): Troponin I (High Sensitivity): 3 ng/L (ref <18)

## 2019-11-24 MED ORDER — ALBUTEROL SULFATE (2.5 MG/3ML) 0.083% IN NEBU
INHALATION_SOLUTION | RESPIRATORY_TRACT | Status: AC
  Administered 2019-11-24: 5 mg via RESPIRATORY_TRACT
  Filled 2019-11-24: qty 6

## 2019-11-24 MED ORDER — ALBUTEROL SULFATE (2.5 MG/3ML) 0.083% IN NEBU: 5.0000 mg | INHALATION_SOLUTION | Freq: Once | RESPIRATORY_TRACT | Status: AC

## 2019-11-24 NOTE — ED Triage Notes (Signed)
Pt to ED reporting increased SOB with hx of asthma. Inhaler has given no relief. Pt reporting she has needed to use her inhaler every hour to keep her breathing under control. No fevers. Dry cough noted upon assessment.

## 2019-11-25 ENCOUNTER — Emergency Department
Admission: EM | Admit: 2019-11-25 | Discharge: 2019-11-25 | Disposition: A | Discharge status: Home / Self Care | Payer: BC Managed Care – PPO | Attending: Emergency Medicine | Admitting: Emergency Medicine

## 2019-11-25 DIAGNOSIS — J4521 Mild intermittent asthma with (acute) exacerbation: Secondary | ICD-10-CM

## 2019-11-25 MED ORDER — IPRATROPIUM-ALBUTEROL 0.5-2.5 (3) MG/3ML IN SOLN
3.0000 mL | Freq: Once | RESPIRATORY_TRACT | Status: AC
  Administered 2019-11-25: 3 mL via RESPIRATORY_TRACT
  Filled 2019-11-25: qty 3

## 2019-11-25 MED ORDER — BENZONATATE 100 MG PO CAPS
100.0000 mg | ORAL_CAPSULE | Freq: Three times a day (TID) | ORAL | 0 refills | Status: DC | PRN
Start: 2019-11-25 — End: 2019-12-12

## 2019-11-25 MED ORDER — ALBUTEROL SULFATE (2.5 MG/3ML) 0.083% IN NEBU
2.5000 mg | INHALATION_SOLUTION | Freq: Once | RESPIRATORY_TRACT | Status: AC
  Administered 2019-11-25: 2.5 mg via RESPIRATORY_TRACT

## 2019-11-25 MED ORDER — ALBUTEROL SULFATE (2.5 MG/3ML) 0.083% IN NEBU
INHALATION_SOLUTION | RESPIRATORY_TRACT | Status: AC
  Filled 2019-11-25: qty 3

## 2019-11-25 MED ORDER — PREDNISONE 20 MG PO TABS
60.0000 mg | ORAL_TABLET | Freq: Once | ORAL | Status: AC
  Administered 2019-11-25: 60 mg via ORAL
  Filled 2019-11-25: qty 3

## 2019-11-25 MED ORDER — ALBUTEROL SULFATE (2.5 MG/3ML) 0.083% IN NEBU
2.5000 mg | INHALATION_SOLUTION | RESPIRATORY_TRACT | 12 refills | Status: DC | PRN
Start: 2019-11-25 — End: 2021-06-18

## 2019-11-25 MED ORDER — COMPRESSOR/NEBULIZER MISC: 1.0000 | Freq: Once | 0 refills | Status: AC

## 2019-11-25 MED ORDER — PREDNISONE 20 MG PO TABS: 60.0000 mg | ORAL_TABLET | Freq: Every day | ORAL | 0 refills | Status: AC

## 2019-11-25 NOTE — ED Provider Notes (Signed)
[facility] Emergency Department Provider Note  ____________________________________________   First MD Initiated Contact with Patient 11/25/19 0309     (approximate)  I have reviewed the triage vital signs and the nursing notes.   HISTORY  Chief Complaint Shortness of Breath    HPI Debra Gonzalez is a 54 y.o. female presents emergency department with a history of increasing wheezing and cough which patient states is been occurring for the past 2 to 3 weeks.  Patient states that this coincides with the period of time that her neighbor has been smoking and she believes this may be the irritant causing her asthma attacks.  Patient states her episode today was unrelieved with inhalers at home.  Patient was seen in urgent care twice in the last 3 weeks secondary to the same placed on prednisone both times as well as azithromycin once.  Patient states that she is moving tomorrow to a new location        Past Medical History:  Diagnosis Date  . Reactive airway disease     There are no problems to display for this patient.   Past Surgical History:  Procedure Laterality Date  . CHOLECYSTECTOMY      Prior to Admission medications   Medication Sig Start Date End Date Taking? Authorizing Provider  albuterol (PROVENTIL HFA;VENTOLIN HFA) 108 (90 Base) MCG/ACT inhaler Inhale 2 puffs into the lungs every 6 (six) hours as needed for wheezing or shortness of breath.    [provider]  azithromycin (ZITHROMAX) 250 MG tablet Take 1 tablet (250 mg total) by mouth daily. Take first 2 tablets together, then 1 every day until finished. 11/07/19   Mickie Bail, NP  benzonatate (TESSALON) 200 MG capsule Take 1 capsule by mouth at bedtime. 11/04/19   [provider]  cephALEXin (KEFLEX) 500 MG capsule Take 1 capsule (500 mg total) by mouth 3 (three) times daily. 04/05/18   Ratcliffe, Heather R, PA-C  cetirizine (ZYRTEC) 10 MG tablet Take 10 mg by mouth daily.     [provider]  fluticasone (FLONASE) 50 MCG/ACT nasal spray Place 2 sprays into both nostrils daily.    [provider]  fluticasone (FLOVENT HFA) 110 MCG/ACT inhaler Inhale into the lungs 2 (two) times daily.    [provider]    Allergies Sulfa antibiotics, Cinnamon, Codeine, Spinach, and Wheat bran  Family History  Problem Relation Age of Onset  . Mitral valve prolapse Mother   . Melanoma Father     Social History Social History   Tobacco Use  . Smoking status: Never Smoker  . Smokeless tobacco: Never Used  Substance Use Topics  . Alcohol use: Yes    Alcohol/week: 1.0 standard drinks    Types: 1 Glasses of wine per week    Comment: weekly  . Drug use: No    Review of Systems Constitutional: No fever/chills Eyes: No visual changes. ENT: No sore throat. Cardiovascular: Denies chest pain. Respiratory: Positive for wheezing dyspnea and cough Gastrointestinal: No abdominal pain.  No nausea, no vomiting.  No diarrhea.  No constipation. Genitourinary: Negative for dysuria. Musculoskeletal: Negative for neck pain.  Negative for back pain. Integumentary: Negative for rash. Neurological: Negative for headaches, focal weakness or numbness.   ____________________________________________   PHYSICAL EXAM:  VITAL SIGNS: ED Triage Vitals [11/24/19 2052]  Enc Vitals Group     BP (!) 154/94     Pulse Rate (!) 122     Resp (!) 25  Temp 98.9 F (37.2 C)     Temp Source Oral     SpO2 98 %     Weight (!) 145.2 kg (320 lb)     Height 1.791 m (5' 10.5")     Head Circumference      Peak Flow      Pain Score 0     Pain Loc      Pain Edu?      Excl. in [place]?     Constitutional: Alert and oriented.  Eyes: Conjunctivae are normal.  Head: Atraumatic. Mouth/Throat: Patient is wearing a mask. Neck: No stridor.  No meningeal signs.   Cardiovascular: Normal rate, regular rhythm. Good peripheral circulation. Grossly normal heart  sounds. Respiratory: Normal respiratory effort.  No retractions. Gastrointestinal: Soft and nontender. No distention.  Musculoskeletal: No lower extremity tenderness nor edema. No gross deformities of extremities. Neurologic:  Normal speech and language. No gross focal neurologic deficits are appreciated.  Skin:  Skin is warm, dry and intact. Psychiatric: Mood and affect are normal. Speech and behavior are normal.  ____________________________________________   LABS (all labs ordered are listed, but only abnormal results are displayed)  Labs Reviewed  BASIC METABOLIC PANEL - Abnormal; Notable for the following components:      Result Value   Glucose, Bld 116 (*)    Calcium 8.8 (*)    All other components within normal limits  CBC  TROPONIN I (HIGH SENSITIVITY)  TROPONIN I (HIGH SENSITIVITY)   ____________________________________________  EKG  ED ECG REPORT I, [place] N Rakiya Krawczyk, the attending physician, personally viewed and interpreted this ECG.   Date: 11/24/2019  EKG Time: 8:42 PM  Rate: 120  Rhythm: Sinus tachycardia  Axis: Normal  Intervals: Normal  ST&T Change: None  ____________________________________________  RADIOLOGY I, [place] N Elveta Rape, personally viewed and evaluated these images (plain radiographs) as part of my medical decision making, as well as reviewing the written report by the radiologist.  ED MD interpretation: No active cardiopulmonary disease noted on chest x-ray.  Official radiology report(s): DG Chest 2 View  Result Date: 11/24/2019 CLINICAL DATA:  Shortness of breath EXAM: CHEST - 2 VIEW COMPARISON:  None. FINDINGS: The heart size and mediastinal contours are within normal limits. Both lungs are clear. The visualized skeletal structures are unremarkable. IMPRESSION: No active cardiopulmonary disease. Electronically Signed   By: Zerita Boers M.D.   On: 11/24/2019 21:20      Procedures   ____________________________________________   INITIAL IMPRESSION / MDM / [place] / ED COURSE  As part of my medical decision making, I reviewed the following data within the electronic MEDICAL RECORD NUMBER   54 year old female presented with above-stated history and physical exam secondary to asthma attack.  Patient received 2 albuterol nebulized treatment and subsequently 2 DuoNeb nebulizer treatment as well as 60 of prednisone with complete resolution of symptoms at present.  ____________________________________________  FINAL CLINICAL IMPRESSION(S) / ED DIAGNOSES  Final diagnoses:  Mild intermittent reactive airway disease with acute exacerbation     MEDICATIONS GIVEN DURING THIS VISIT:  Medications  albuterol (PROVENTIL) (2.5 MG/3ML) 0.083% nebulizer solution (has no administration in time range)  albuterol (PROVENTIL) (2.5 MG/3ML) 0.083% nebulizer solution 5 mg (5 mg Nebulization Given 11/24/19 2055)  albuterol (PROVENTIL) (2.5 MG/3ML) 0.083% nebulizer solution 2.5 mg (2.5 mg Nebulization Given 11/25/19 0140)  predniSONE (DELTASONE) tablet 60 mg (60 mg Oral Given 11/25/19 0208)  ipratropium-albuterol (DUONEB) 0.5-2.5 (3) MG/3ML nebulizer solution 3 mL (3 mLs Nebulization Given 11/25/19 0209)  ipratropium-albuterol (DUONEB) 0.5-2.5 (3) MG/3ML nebulizer solution 3 mL (3 mLs Nebulization Given 11/25/19 0209)     ED Discharge Orders    None      *Please note:  Debra Gonzalez was evaluated in Emergency Department on 11/25/2019 for the symptoms described in the history of present illness. She was evaluated in the context of the global COVID-19 pandemic, which necessitated consideration that the patient might be at risk for infection with the SARS-CoV-2 virus that causes COVID-19. Institutional protocols and algorithms that pertain to the evaluation of patients at risk for COVID-19 are in a state of rapid change based on information released by regulatory  bodies including the CDC and federal and state organizations. These policies and algorithms were followed during the patient's care in the ED.  Some ED evaluations and interventions may be delayed as a result of limited staffing during the pandemic.*  Note:  This document was prepared using Dragon voice recognition software and may include unintentional dictation errors.   Darci Current, MD 11/25/19 [phone]

## 2019-11-25 NOTE — ED Notes (Addendum)
Pt with frequent dry cough and diminished BS sounds noted; pt to triage for vs & neb tx; st zpak and prednisone 3wks ago by [facility]; pt st has not had issues in many years with her asthma but has had a neighbor in her apartment next to her that smokes alot

## 2019-11-25 NOTE — ED Notes (Signed)
Pt reports feeling much better now after meds; resp even/unlab, lungs clear

## 2019-12-12 ENCOUNTER — Other Ambulatory Visit: Payer: Self-pay

## 2019-12-12 ENCOUNTER — Ambulatory Visit (INDEPENDENT_AMBULATORY_CARE_PROVIDER_SITE_OTHER)
Admission: RE | Admit: 2019-12-12 | Discharge: 2019-12-12 | Disposition: A | Discharge status: Home / Self Care | Payer: BC Managed Care – PPO | Source: Ambulatory Visit | Attending: Internal Medicine | Admitting: Internal Medicine

## 2019-12-12 ENCOUNTER — Encounter: Payer: Self-pay | Admitting: Internal Medicine

## 2019-12-12 ENCOUNTER — Ambulatory Visit: Payer: BC Managed Care – PPO | Admitting: Internal Medicine

## 2019-12-12 VITALS — BP 138/86 | HR 81 | Temp 97.6°F | Ht 69.25 in | Wt 327.0 lb

## 2019-12-12 DIAGNOSIS — R030 Elevated blood-pressure reading, without diagnosis of hypertension: Secondary | ICD-10-CM | POA: Insufficient documentation

## 2019-12-12 DIAGNOSIS — R05 Cough: Secondary | ICD-10-CM | POA: Diagnosis not present

## 2019-12-12 DIAGNOSIS — E7800 Pure hypercholesterolemia, unspecified: Secondary | ICD-10-CM | POA: Diagnosis not present

## 2019-12-12 DIAGNOSIS — R0781 Pleurodynia: Secondary | ICD-10-CM

## 2019-12-12 DIAGNOSIS — J45909 Unspecified asthma, uncomplicated: Secondary | ICD-10-CM | POA: Insufficient documentation

## 2019-12-12 DIAGNOSIS — J4540 Moderate persistent asthma, uncomplicated: Secondary | ICD-10-CM | POA: Diagnosis not present

## 2019-12-12 DIAGNOSIS — E785 Hyperlipidemia, unspecified: Secondary | ICD-10-CM | POA: Insufficient documentation

## 2019-12-12 NOTE — Patient Instructions (Signed)
Asthma, Adult  Asthma is a long-term (chronic) condition in which the airways get tight and narrow. The airways are the breathing passages that lead from the nose and mouth down into the lungs. A person with asthma will have times when symptoms get worse. These are called asthma attacks. They can cause coughing, whistling sounds when you breathe (wheezing), shortness of breath, and chest pain. They can make it hard to breathe. There is no cure for asthma, but medicines and lifestyle changes can help control it. There are many things that can bring on an asthma attack or make asthma symptoms worse (triggers). Common triggers include:  Mold.  Dust.  Cigarette smoke.  Cockroaches.  Things that can cause allergy symptoms (allergens). These include animal skin flakes (dander) and pollen from trees or grass.  Things that pollute the air. These may include household cleaners, wood smoke, smog, or chemical odors.  Cold air, weather changes, and wind.  Crying or laughing hard.  Stress.  Certain medicines or drugs.  Certain foods such as dried fruit, potato chips, and grape juice.  Infections, such as a cold or the flu.  Certain medical conditions or diseases.  Exercise or tiring activities. Asthma may be treated with medicines and by staying away from the things that cause asthma attacks. Types of medicines may include:  Controller medicines. These help prevent asthma symptoms. They are usually taken every day.  Fast-acting reliever or rescue medicines. These quickly relieve asthma symptoms. They are used as needed and provide short-term relief.  Allergy medicines if your attacks are brought on by allergens.  Medicines to help control the body's defense (immune) system. Follow these instructions at home: Avoiding triggers in your home  Change your heating and air conditioning filter often.  Limit your use of fireplaces and wood stoves.  Get rid of pests (such as roaches and  mice) and their droppings.  Throw away plants if you see mold on them.  Clean your floors. Dust regularly. Use cleaning products that do not smell.  Have someone vacuum when you are not home. Use a vacuum cleaner with a HEPA filter if possible.  Replace carpet with wood, tile, or vinyl flooring. Carpet can trap animal skin flakes and dust.  Use allergy-proof pillows, mattress covers, and box spring covers.  Wash bed sheets and blankets every week in hot water. Dry them in a dryer.  Keep your bedroom free of any triggers.  Avoid pets and keep windows closed when things that cause allergy symptoms are in the air.  Use blankets that are made of polyester or cotton.  Clean bathrooms and kitchens with bleach. If possible, have someone repaint the walls in these rooms with mold-resistant paint. Keep out of the rooms that are being cleaned and painted.  Wash your hands often with soap and water. If soap and water are not available, use hand sanitizer.  Do not allow anyone to smoke in your home. General instructions  Take over-the-counter and prescription medicines only as told by your doctor. ? Talk with your doctor if you have questions about how or when to take your medicines. ? Make note if you need to use your medicines more often than usual.  Do not use any products that contain nicotine or tobacco, such as cigarettes and e-cigarettes. If you need help quitting, ask your doctor.  Stay away from secondhand smoke.  Avoid doing things outdoors when allergen counts are high and when air quality is low.  Wear a ski mask   when doing outdoor activities in the winter. The mask should cover your nose and mouth. Exercise indoors on cold days if you can.  Warm up before you exercise. Take time to cool down after exercise.  Use a peak flow meter as told by your doctor. A peak flow meter is a tool that measures how well the lungs are working.  Keep track of the peak flow meter's readings.  Write them down.  Follow your asthma action plan. This is a written plan for taking care of your asthma and treating your attacks.  Make sure you get all the shots (vaccines) that your doctor recommends. Ask your doctor about a flu shot and a pneumonia shot.  Keep all follow-up visits as told by your doctor. This is important. Contact a doctor if:  You have wheezing, shortness of breath, or a cough even while taking medicine to prevent attacks.  The mucus you cough up (sputum) is thicker than usual.  The mucus you cough up changes from clear or white to yellow, green, gray, or bloody.  You have problems from the medicine you are taking, such as: ? A rash. ? Itching. ? Swelling. ? Trouble breathing.  You need reliever medicines more than 2-3 times a week.  Your peak flow reading is still at 50-79% of your personal best after following the action plan for 1 hour.  You have a fever. Get help right away if:  You seem to be worse and are not responding to medicine during an asthma attack.  You are short of breath even at rest.  You get short of breath when doing very little activity.  You have trouble eating, drinking, or talking.  You have chest pain or tightness.  You have a fast heartbeat.  Your lips or fingernails start to turn blue.  You are light-headed or dizzy, or you faint.  Your peak flow is less than 50% of your personal best.  You feel too tired to breathe normally. Summary  Asthma is a long-term (chronic) condition in which the airways get tight and narrow. An asthma attack can make it hard to breathe.  Asthma cannot be cured, but medicines and lifestyle changes can help control it.  Make sure you understand how to avoid triggers and how and when to use your medicines. This information is not intended to replace advice given to you by your health care provider. Make sure you discuss any questions you have with your health care provider. Document Revised:  08/26/2018 Document Reviewed: 07/28/2016 Elsevier Patient Education  2020 [employer] Inc[employer]  

## 2019-12-12 NOTE — Assessment & Plan Note (Addendum)
Will continue to monitor for now Encouraged DASH diet and exercise for weight loss

## 2019-12-12 NOTE — Assessment & Plan Note (Signed)
Uncontrolled Continue Flovent- she can not tolerate BID Continue Albuterol as needed Referral to pulmonology placed

## 2019-12-12 NOTE — Progress Notes (Signed)
HPI  Pt presents to the clinic today to establish care and for management of the conditions listed below. She is transferring care from [facility] [place], [place].  Asthma: Managed with Flovent and Albuterol. There are no PFT's on file. She is not seeing a pulmonologist.  HLD: There is no LDL on file. She is not taking any cholesterol lowering medication. She does not consume a low fat diet.  Elevated Blood Pressure: Her BP today is 138/86. She is not taking any antihypertensive therapy.   Flu: never Tetanus: > 10 years Pap Smear: 2018, [facility] Mammogram: Colon Screening: never Vision Screening: as needed Dentist: bianually  Past Medical History:  Diagnosis Date  . Asthma   . Hyperlipidemia   . Reactive airway disease     Current Outpatient Medications  Medication Sig Dispense Refill  . acetaminophen (TYLENOL) 500 MG tablet Take 500 mg by mouth every 6 (six) hours as needed.    Marland Kitchen albuterol (PROVENTIL HFA;VENTOLIN HFA) 108 (90 Base) MCG/ACT inhaler Inhale 2 puffs into the lungs every 6 (six) hours as needed for wheezing or shortness of breath.    Marland Kitchen albuterol (PROVENTIL) (2.5 MG/3ML) 0.083% nebulizer solution Take 3 mLs (2.5 mg total) by nebulization every 4 (four) hours as needed for wheezing or shortness of breath. 75 mL 12  . b complex vitamins tablet Take 1 tablet by mouth daily.    . Biotin 1000 MCG tablet Take 1,000 mcg by mouth 3 (three) times daily.    . cetirizine (ZYRTEC) 10 MG tablet Take 10 mg by mouth daily.    . Cholecalciferol (VITAMIN D-3) 5000 UNIT/ML LIQD Place under the tongue.    . fluticasone (FLONASE) 50 MCG/ACT nasal spray Place 2 sprays into both nostrils daily.    . fluticasone (FLOVENT HFA) 110 MCG/ACT inhaler Inhale into the lungs 2 (two) times daily.    Marland Kitchen ibuprofen (ADVIL) 200 MG tablet Take 200 mg by mouth every 6 (six) hours as needed.    . Magnesium 100 MG TABS Take by mouth.    . Multiple Vitamin (MULTIVITAMIN) tablet Take 1 tablet by mouth daily.      No current facility-administered medications for this visit.    Allergies  Allergen Reactions  . Sulfa Antibiotics Nausea And Vomiting  . Cinnamon     "eating too much causes lip swelling"  . Codeine Nausea Only  . Spinach Swelling    If 'eats too much gets lip swelling'  . Wheat Bran     "eating too much causes lip swelling"    Family History  Problem Relation Age of Onset  . Mitral valve prolapse Mother   . Melanoma Father     Social History   Socioeconomic History  . Marital status: Divorced    Spouse name: Not on file  . Number of children: Not on file  . Years of education: Not on file  . Highest education level: Not on file  Occupational History  . Not on file  Tobacco Use  . Smoking status: Never Smoker  . Smokeless tobacco: Never Used  Substance and Sexual Activity  . Alcohol use: Yes    Alcohol/week: 1.0 standard drinks    Types: 1 Glasses of wine per week    Comment: weekly  . Drug use: No  . Sexual activity: Not on file  Other Topics Concern  . Not on file  Social History Narrative  . Not on file   Social Determinants of Health   Financial Resource Strain:   .  Difficulty of Paying Living Expenses:   Food Insecurity:   . Worried About Programme researcher, broadcasting/film/video in the Last Year:   . Barista in the Last Year:   Transportation Needs:   . Freight forwarder (Medical):   Marland Kitchen Lack of Transportation (Non-Medical):   Physical Activity:   . Days of Exercise per Week:   . Minutes of Exercise per Session:   Stress:   . Feeling of Stress :   Social Connections:   . Frequency of Communication with Friends and Family:   . Frequency of Social Gatherings with Friends and Family:   . Attends Religious Services:   . Active Member of Clubs or Organizations:   . Attends Banker Meetings:   Marland Kitchen Marital Status:   Intimate Partner Violence:   . Fear of Current or Ex-Partner:   . Emotionally Abused:   Marland Kitchen Physically Abused:   . Sexually  Abused:     ROS:  Constitutional: Denies fever, malaise, fatigue, headache or abrupt weight changes.  HEENT: Denies eye pain, eye redness, ear pain, ringing in the ears, wax buildup, runny nose, nasal congestion, bloody nose, or sore throat. Respiratory: Pt reports chronic cough, SOB. Denies difficulty breathing or sputum production.   Cardiovascular: Denies chest pain, chest tightness, palpitations or swelling in the hands or feet.  Gastrointestinal: Denies abdominal pain, bloating, constipation, diarrhea or blood in the stool.  GU: Denies frequency, urgency, pain with urination, blood in urine, odor or discharge. Musculoskeletal: Pt reports left side rib pain. Denies decrease in range of motion, difficulty with gait, muscle pain or joint swelling.  Skin: Denies redness, rashes, lesions or ulcercations.  Neurological: Denies dizziness, difficulty with memory, difficulty with speech or problems with balance and coordination.  Psych: Denies anxiety, depression, SI/HI.  No other specific complaints in a complete review of systems (except as listed in HPI above).  PE:  BP 138/86   Pulse 81   Temp 97.6 F (36.4 C) (Temporal)   Ht 5' 9.25" (1.759 m)   Wt (!) 327 lb (148.3 kg)   LMP  (LMP Unknown)   SpO2 98%   BMI 47.94 kg/m   Wt Readings from Last 3 Encounters:  11/24/19 (!) 320 lb (145.2 kg)  11/15/19 (!) 320 lb (145.2 kg)  11/07/19 (!) 320 lb (145.2 kg)    General: Appears her stated age, obese, in NAD. Skin: Dry and intact. Neck: No adenopathy noted. Cardiovascular: Normal rate and rhythm. S1,S2 noted.  No murmur, rubs or gallops noted.  Pulmonary/Chest: Normal effort and positive vesicular breath sounds. No respiratory distress. No wheezes, rales or ronchi noted.  Musculoskeletal: She points to left lower ribs as site of her rib pain. No difficulty with gait.  Neurological: Alert and oriented.  Psychiatric: Mood and affect normal. Behavior is normal. Judgment and thought  content normal.    BMET    Component Value Date/Time   NA 138 11/24/2019 2056   K 4.1 11/24/2019 2056   CL 104 11/24/2019 2056   CO2 22 11/24/2019 2056   GLUCOSE 116 (H) 11/24/2019 2056   BUN 14 11/24/2019 2056   CREATININE 0.75 11/24/2019 2056   CALCIUM 8.8 (L) 11/24/2019 2056   GFRNONAA >60 11/24/2019 2056   GFRAA >60 11/24/2019 2056    Lipid Panel  No results found for: CHOL, TRIG, HDL, CHOLHDL, VLDL, LDLCALC  CBC    Component Value Date/Time   WBC 7.7 11/24/2019 2056   RBC 4.34 11/24/2019 2056  HGB 14.2 11/24/2019 2056   HCT 40.7 11/24/2019 2056   PLT 289 11/24/2019 2056   MCV 93.8 11/24/2019 2056   MCH 32.7 11/24/2019 2056   MCHC 34.9 11/24/2019 2056   RDW 12.4 11/24/2019 2056    Hgb A1C No results found for: HGBA1C   Assessment and Plan:  Left Side Rib Pain:  DG left ribs with chest Advised her if there is a rib fracture, we do not bind the ribs any more Continue Tylenol OTC as needed for pain  Will follow up after xray, return precautions discussed Nicki Reaper, NP This visit occurred during the SARS-CoV-2 public health emergency.  Safety protocols were in place, including screening questions prior to the visit, additional usage of staff PPE, and extensive cleaning of exam room while observing appropriate contact time as indicated for disinfecting solutions.

## 2019-12-12 NOTE — Assessment & Plan Note (Signed)
Encouraged low fat diet 

## 2019-12-15 ENCOUNTER — Telehealth: Payer: Self-pay | Admitting: *Deleted

## 2019-12-15 NOTE — Telephone Encounter (Signed)
Pt called triage requesting xray results. Pt advised that xray results were released on mychart, I did advise pt of PCP's comments regarding neg xray. Pt states that she is still having pain in the same area. Pt said that tylenol is only taking the "edge" off the pain and she doesn't know what to do. Pt advised PCP out of the office this afternoon and that they will f/u with her when she returns

## 2019-12-15 NOTE — Telephone Encounter (Cosign Needed)
What is she taking for the pain? If the pain is severe, she may want to follow up or go to UC for further evaluation

## 2019-12-16 ENCOUNTER — Ambulatory Visit (INDEPENDENT_AMBULATORY_CARE_PROVIDER_SITE_OTHER): Payer: BC Managed Care – PPO | Admitting: Internal Medicine

## 2019-12-16 ENCOUNTER — Encounter: Payer: Self-pay | Admitting: Internal Medicine

## 2019-12-16 ENCOUNTER — Other Ambulatory Visit: Payer: Self-pay

## 2019-12-16 VITALS — BP 126/84 | HR 84 | Temp 98.7°F | Wt 327.0 lb

## 2019-12-16 DIAGNOSIS — R0781 Pleurodynia: Secondary | ICD-10-CM | POA: Diagnosis not present

## 2019-12-16 MED ORDER — DEXAMETHASONE SODIUM PHOSPHATE 10 MG/ML IJ SOLN
10.0000 mg | Freq: Once | INTRAMUSCULAR | Status: AC
  Administered 2019-12-16: 10 mg via INTRAMUSCULAR

## 2019-12-16 MED ORDER — CYCLOBENZAPRINE HCL 10 MG PO TABS
10.0000 mg | ORAL_TABLET | Freq: Every evening | ORAL | 0 refills | Status: DC | PRN
Start: 2019-12-16 — End: 2020-02-20

## 2019-12-16 NOTE — Telephone Encounter (Signed)
I have pt scheduled for today at 4:30

## 2019-12-16 NOTE — Progress Notes (Signed)
Subjective:    Patient ID: Debra Gonzalez, female    DOB: June 20, 1966, 54 y.o.   MRN: [id number]  HPI  Patient presents to clinic today to follow-up on rib pain.  This started about 1 week ago.  She felt like she had a broken ribs secondary to vigorous coughing.  X-ray of her ribs from 6/7 showed no acute rib fracture. She reports the pain is sore and achy but can be sharp and stabbing with certain movements. She denies chest pain or SOB. She has been coughing. She denies nausea, vomiting, diarrhea, constipation or blood in her stool. She denies urinary or vaginal complaints. She has not had any relief with OTC medications.  Review of Systems      Past Medical History:  Diagnosis Date  . Asthma   . Hyperlipidemia   . Reactive airway disease     Current Outpatient Medications  Medication Sig Dispense Refill  . acetaminophen (TYLENOL) 500 MG tablet Take 500 mg by mouth every 6 (six) hours as needed.    Marland Kitchen albuterol (PROVENTIL HFA;VENTOLIN HFA) 108 (90 Base) MCG/ACT inhaler Inhale 2 puffs into the lungs every 6 (six) hours as needed for wheezing or shortness of breath.    Marland Kitchen albuterol (PROVENTIL) (2.5 MG/3ML) 0.083% nebulizer solution Take 3 mLs (2.5 mg total) by nebulization every 4 (four) hours as needed for wheezing or shortness of breath. 75 mL 12  . b complex vitamins tablet Take 1 tablet by mouth daily.    . Biotin 1000 MCG tablet Take 1,000 mcg by mouth 3 (three) times daily.    . cetirizine (ZYRTEC) 10 MG tablet Take 10 mg by mouth daily.    . Cholecalciferol (VITAMIN D-3) 5000 UNIT/ML LIQD Place under the tongue.    . fluticasone (FLONASE) 50 MCG/ACT nasal spray Place 2 sprays into both nostrils daily.    . fluticasone (FLOVENT HFA) 110 MCG/ACT inhaler Inhale into the lungs 2 (two) times daily.    Marland Kitchen ibuprofen (ADVIL) 200 MG tablet Take 200 mg by mouth every 6 (six) hours as needed.    . Magnesium 100 MG TABS Take by mouth.    . Multiple Vitamin (MULTIVITAMIN) tablet Take 1 tablet  by mouth daily.     No current facility-administered medications for this visit.    Allergies  Allergen Reactions  . Sulfa Antibiotics Nausea And Vomiting  . Cinnamon     "eating too much causes lip swelling"  . Codeine Nausea Only  . Spinach Swelling    If 'eats too much gets lip swelling'  . Wheat Bran     "eating too much causes lip swelling"    Family History  Problem Relation Age of Onset  . Mitral valve prolapse Mother   . Depression Mother   . Hyperlipidemia Mother   . Hypertension Mother   . Melanoma Father   . Hyperlipidemia Maternal Grandmother   . Hypertension Maternal Grandmother   . Diabetes Maternal Grandfather   . Hyperlipidemia Paternal Grandmother   . Stroke Paternal Grandmother   . Bone cancer Paternal Grandfather     Social History   Socioeconomic History  . Marital status: Divorced    Spouse name: Not on file  . Number of children: Not on file  . Years of education: Not on file  . Highest education level: Not on file  Occupational History  . Not on file  Tobacco Use  . Smoking status: Never Smoker  . Smokeless tobacco: Never Used  Vaping Use  .  Vaping Use: Never used  Substance and Sexual Activity  . Alcohol use: Yes    Alcohol/week: 1.0 standard drink    Types: 1 Glasses of wine per week    Comment: weekly  . Drug use: No  . Sexual activity: Not on file  Other Topics Concern  . Not on file  Social History Narrative  . Not on file   Social Determinants of Health   Financial Resource Strain:   . Difficulty of Paying Living Expenses:   Food Insecurity:   . Worried About Charity fundraiser in the Last Year:   . Arboriculturist in the Last Year:   Transportation Needs:   . Film/video editor (Medical):   Marland Kitchen Lack of Transportation (Non-Medical):   Physical Activity:   . Days of Exercise per Week:   . Minutes of Exercise per Session:   Stress:   . Feeling of Stress :   Social Connections:   . Frequency of Communication with  Friends and Family:   . Frequency of Social Gatherings with Friends and Family:   . Attends Religious Services:   . Active Member of Clubs or Organizations:   . Attends Archivist Meetings:   Marland Kitchen Marital Status:   Intimate Partner Violence:   . Fear of Current or Ex-Partner:   . Emotionally Abused:   Marland Kitchen Physically Abused:   . Sexually Abused:      Constitutional: Denies fever, malaise, fatigue, headache or abrupt weight changes.  Respiratory: Pt reports cough. Denies difficulty breathing, shortness of breath, or sputum production.   Cardiovascular: Denies chest pain, chest tightness, palpitations or swelling in the hands or feet.  Gastrointestinal: Denies abdominal pain, bloating, constipation, diarrhea or blood in the stool.  GU: Denies urgency, frequency, pain with urination, burning sensation, blood in urine, odor or discharge. Musculoskeletal: Pt reports left side rib pain. Denies decrease in range of motion, difficulty with gait, muscle pain or joint swelling.  Skin: Denies redness, rashes, lesions or ulcercations.   No other specific complaints in a complete review of systems (except as listed in HPI above).  Objective:   Physical Exam  BP 126/84   Pulse 84   Temp 98.7 F (37.1 C) (Temporal)   Wt (!) 327 lb (148.3 kg)   LMP  (LMP Unknown)   SpO2 98%   BMI 47.94 kg/m   Wt Readings from Last 3 Encounters:  12/12/19 (!) 327 lb (148.3 kg)  11/24/19 (!) 320 lb (145.2 kg)  11/15/19 (!) 320 lb (145.2 kg)    General: Appears her stated age, obese, in NAD. Skin: Warm, dry and intact. No rashes, lesions or ulcerations noted. Cardiovascular: Normal rate and rhythm. Pulmonary/Chest: Normal effort and positive vesicular breath sounds. No respiratory distress. No wheezes, rales or ronchi noted.  Abdomen: Soft and tender in the LUQ but this is over the lower anterior ribs. Normal bowel sounds.  Musculoskeletal: Pain with palpation over the left anterior  ribs. Neurological: Alert and oriented.   BMET    Component Value Date/Time   NA 138 11/24/2019 2056   K 4.1 11/24/2019 2056   CL 104 11/24/2019 2056   CO2 22 11/24/2019 2056   GLUCOSE 116 (H) 11/24/2019 2056   BUN 14 11/24/2019 2056   CREATININE 0.75 11/24/2019 2056   CALCIUM 8.8 (L) 11/24/2019 2056   GFRNONAA >60 11/24/2019 2056   GFRAA >60 11/24/2019 2056    Lipid Panel  No results found for: CHOL, TRIG, HDL, CHOLHDL,  VLDL, LDLCALC  CBC    Component Value Date/Time   WBC 7.7 11/24/2019 2056   RBC 4.34 11/24/2019 2056   HGB 14.2 11/24/2019 2056   HCT 40.7 11/24/2019 2056   PLT 289 11/24/2019 2056   MCV 93.8 11/24/2019 2056   MCH 32.7 11/24/2019 2056   MCHC 34.9 11/24/2019 2056   RDW 12.4 11/24/2019 2056    Hgb A1C No results found for: HGBA1C         Assessment & Plan:   Left Side Rib Pain:  Xray with no evidence of fracture ?shingles without the rash, intercostal nerve irritation, costochondritis Decadron 10 mg IM today RX for Flexeril TID prn- sedation caution given  Return precautions discussed  Nicki Reaper, NP This visit occurred during the SARS-CoV-2 public health emergency.  Safety protocols were in place, including screening questions prior to the visit, additional usage of staff PPE, and extensive cleaning of exam room while observing appropriate contact time as indicated for disinfecting solutions.

## 2019-12-18 ENCOUNTER — Encounter: Payer: Self-pay | Admitting: Internal Medicine

## 2019-12-18 NOTE — Patient Instructions (Signed)

## 2019-12-30 ENCOUNTER — Other Ambulatory Visit: Payer: Self-pay

## 2019-12-30 ENCOUNTER — Ambulatory Visit: Payer: BC Managed Care – PPO | Admitting: Pulmonary Disease

## 2019-12-30 ENCOUNTER — Encounter: Payer: Self-pay | Admitting: Pulmonary Disease

## 2019-12-30 VITALS — BP 130/82 | HR 94 | Temp 97.3°F | Ht 69.25 in | Wt 325.4 lb

## 2019-12-30 DIAGNOSIS — J45909 Unspecified asthma, uncomplicated: Secondary | ICD-10-CM | POA: Diagnosis not present

## 2019-12-30 DIAGNOSIS — E669 Obesity, unspecified: Secondary | ICD-10-CM

## 2019-12-30 DIAGNOSIS — R0602 Shortness of breath: Secondary | ICD-10-CM | POA: Diagnosis not present

## 2019-12-30 MED ORDER — BREO ELLIPTA 100-25 MCG/INH IN AEPB: 1.0000 | INHALATION_SPRAY | Freq: Every day | RESPIRATORY_TRACT | 11 refills | Status: DC

## 2019-12-30 NOTE — Progress Notes (Signed)
Subjective:    Patient ID: Debra Gonzalez, female    DOB: [birth year], 54 y.o.   MRN: [id number]  HPI Patient is a 54 year old flow never smoker who presents for evaluation of shortness of breath and wheezing.  She is kindly referred by Webb Silversmith, NP.  Patient states that she has been diagnosed previously with reactive airways disease and has had to use albuterol intermittently.  She states that for a while she actually did well needing no albuterol at all this was for a period of approximately 5 years.  She did have some issues where she had a upper respiratory infection around January and this subsequently made her more sensitive to odors and fumes.  Around that time her neighbor started smoking tobacco as well as smoking marijuana and this would permeate her duplex unit.  She would have to use the albuterol frequently then.  She has finally moved out of the unit and this has improved somewhat but she continues to have some difficulties with wheezing and shortness of breath.  In February she was started on Flovent by Dr. Pryor Ochoa and she feels that this has helped somewhat.  Only however using this once daily.  She states that she cannot tolerate it twice a day.  She is still however using albuterol approximately 5 times per day.  She developed weight gain during the COVID-19 lockdowns and she is now struggling to lose the weight.  She also notices shortness of breath worsening with this.  She has not had any fevers, chills or sweats she has cough mostly at night which is productive of cream-colored sputum at times but not consistently.  No hemoptysis.  She has had edema on the left ankle this is a chronic issue "comes and goes.  She has had some mild orthopnea and nocturnal awakenings with wheezing.  She voices no other complaint.  She previously resided in [place].  She notes that cool air helps her breathing as well.  She voices no other complaints.  Review of Systems A 10 point review of systems was  performed and it is as noted above otherwise negative.  Past Medical History:  Diagnosis Date  . Asthma   . Hyperlipidemia   . Reactive airway disease    Past Surgical History:  Procedure Laterality Date  . CHOLECYSTECTOMY    . TONSILLECTOMY     Family History  Problem Relation Age of Onset  . Mitral valve prolapse Mother   . Depression Mother   . Hyperlipidemia Mother   . Hypertension Mother   . Melanoma Father   . Hyperlipidemia Maternal Grandmother   . Hypertension Maternal Grandmother   . Diabetes Maternal Grandfather   . Hyperlipidemia Paternal Grandmother   . Stroke Paternal Grandmother   . Bone cancer Paternal Grandfather    Social History   Tobacco Use  . Smoking status: Never Smoker  . Smokeless tobacco: Never Used  Substance Use Topics  . Alcohol use: Yes    Alcohol/week: 1.0 standard drink    Types: 1 Glasses of wine per week    Comment: weekly   She works at [employer] in the [employer].  No military history.  No occupational exposure.  Has previously resided in [place].  Allergies  Allergen Reactions  . Sulfa Antibiotics Nausea And Vomiting  . Cinnamon     "eating too much causes lip swelling"  . Codeine Nausea Only  . Spinach Swelling    If 'eats too much gets lip swelling'  .  Wheat Bran     "eating too much causes lip swelling"   Current Meds  Medication Sig  . acetaminophen (TYLENOL) 500 MG tablet Take 500 mg by mouth every 6 (six) hours as needed.  Marland Kitchen albuterol (PROVENTIL HFA;VENTOLIN HFA) 108 (90 Base) MCG/ACT inhaler Inhale 2 puffs into the lungs every 6 (six) hours as needed for wheezing or shortness of breath.  Marland Kitchen albuterol (PROVENTIL) (2.5 MG/3ML) 0.083% nebulizer solution Take 3 mLs (2.5 mg total) by nebulization every 4 (four) hours as needed for wheezing or shortness of breath.  Marland Kitchen b complex vitamins tablet Take 1 tablet by mouth daily.  . Biotin 1000 MCG tablet Take 1,000 mcg by mouth 3 (three) times daily.  . cetirizine  (ZYRTEC) 10 MG tablet Take 10 mg by mouth daily.  . Cholecalciferol (VITAMIN D-3) 5000 UNIT/ML LIQD Place under the tongue.  . cyclobenzaprine (FLEXERIL) 10 MG tablet Take 1 tablet (10 mg total) by mouth at bedtime as needed for muscle spasms.  . fluticasone (FLONASE) 50 MCG/ACT nasal spray Place 2 sprays into both nostrils daily.  . fluticasone (FLOVENT HFA) 110 MCG/ACT inhaler Inhale into the lungs 2 (two) times daily.  Marland Kitchen ibuprofen (ADVIL) 200 MG tablet Take 200 mg by mouth every 6 (six) hours as needed.  . Magnesium 100 MG TABS Take by mouth.  . Multiple Vitamin (MULTIVITAMIN) tablet Take 1 tablet by mouth daily.   Immunization History  Administered Date(s) Administered  . PFIZER SARS-COV-2 Vaccination 10/02/2019, 10/23/2019       Objective:   Physical Exam BP 130/82 (Cuff Size: Normal)   Pulse 94   Temp (!) 97.3 F (36.3 C) (Temporal)   Ht 5' 9.25" (1.759 m)   Wt (!) 325 lb 6.4 oz (147.6 kg)   LMP  (LMP Unknown)   SpO2 97%   BMI 47.71 kg/m  GENERAL: Well-developed obese woman in no acute distress.  She is fully ambulatory. HEAD: Normocephalic, atraumatic.  EYES: Pupils equal, round, reactive to light.  No scleral icterus.  MOUTH: Nose/mouth/throat not examined due to masking requirements for COVID 19. NECK: Supple. No thyromegaly. Trachea midline. No JVD.  No adenopathy. PULMONARY: Faint end expiratory wheeze which disappeared with deep inspiration.  Otherwise no other adventitious sounds.  Good air entry bilaterally. CARDIOVASCULAR: S1 and S2. Regular rate and rhythm.  No rubs, murmurs or gallops heard. GASTROINTESTINAL: Obese, nondistended. MUSCULOSKELETAL: No joint deformity, no clubbing, ankle edema on left 1+.  NEUROLOGIC: No overt focal deficit, no gait disturbance noted.  Speech is fluent. SKIN: Intact,warm,dry.  Limited exam no rashes. PSYCH: Mood and behavior are normal.     Assessment & Plan:     ICD-10-CM   1. Asthma in adult without complication,  unspecified asthma severity, unspecified whether persistent  J45.909    We will obtain PFTs Change Flovent to Breo Ellipta 100/25 1 inhalation daily Follow-up in 3 months call sooner should any new difficulties arise  2. SOB (shortness of breath)  R06.02 Pulmonary Function Test [facility] Only   PFTs as above  3. Obesity (BMI 35.0-39.9 without comorbidity)  E66.9    This issue adds complexity to her management Obesity increases inflammatory response in the airway Patient is engaged in a wellness program and weight loss   Meds ordered this encounter  Medications  . fluticasone furoate-vilanterol (BREO ELLIPTA) 100-25 MCG/INH AEPB    Sig: Inhale 1 puff into the lungs daily.    Dispense:  28 each    Refill:  11   Discussion:  Patient is currently using albuterol 5 times a day despite Flovent HFA.  Will switch to Breo Ellipta 1 inhalation daily this combination LABA/ICS.  Will obtain PFTs.  She had mild wheezing noted today.  Suspect asthma likely mild to moderate persistent.  We discussed weight loss.  She is currently engaged in a weight loss program she was commended for this.  She was reminded that obesity increases inflammatory response particularly in patients with asthma.  We will see her in follow-up in 3 months time she is to contact us prior to that time should any new difficulties arise.  Gailen Shelter, MD [facility]   *This note was dictated using voice recognition software/Dragon.  Despite best efforts to proofread, errors can occur which can change the meaning.  Any change was purely unintentional.

## 2019-12-30 NOTE — Patient Instructions (Signed)
What we discussed today:   We are going to get breathing tests to evaluate your asthma  We are switching your Flovent HFA for Breo Ellipta 100/25 1 inhalation daily  Rinse your mouth well after you use the Breo you may use a little baking soda in the water to neutralize the powder  We will see you in follow-up in 3 months time call sooner should any new difficulties arise

## 2020-01-11 ENCOUNTER — Telehealth: Payer: Self-pay | Admitting: Pulmonary Disease

## 2020-01-11 NOTE — Telephone Encounter (Signed)
ATC pt-no answer with no option to leave vm. Will call back.  

## 2020-01-11 NOTE — Telephone Encounter (Signed)
Pt is aware of below message and voiced her understanding. Nothing further is needed at this time.  

## 2020-01-11 NOTE — Telephone Encounter (Signed)
The hallucinations are not a known side effect from Roy.  If her symptoms are getting better she can go back to the Flovent prescribed by Dr. Andee Poles.  If symptoms persist it is definitely not a Breo side effect which should have cleared up on 1 day of discontinuing.  She would then need to be evaluated by her primary practitioner.

## 2020-01-11 NOTE — Telephone Encounter (Signed)
Pt states since starting Breo, she has developed hallucinations, headaches, prod cough with yellow to greenish mucus and throat hoariness.  Pt started [facility] 12/30/2019 and symptoms developed 2-3 days after.  Pt stopped Breo on 01/07/2020. Since stopping Breo, she has had some improvement in symptoms.     Dr. Jayme Cloud, please advise. Thanks.

## 2020-01-12 ENCOUNTER — Telehealth (INDEPENDENT_AMBULATORY_CARE_PROVIDER_SITE_OTHER): Payer: BC Managed Care – PPO | Admitting: Internal Medicine

## 2020-01-12 ENCOUNTER — Other Ambulatory Visit: Payer: Self-pay

## 2020-01-12 ENCOUNTER — Encounter: Payer: Self-pay | Admitting: Internal Medicine

## 2020-01-12 VITALS — Temp 97.8°F

## 2020-01-12 DIAGNOSIS — J019 Acute sinusitis, unspecified: Secondary | ICD-10-CM | POA: Diagnosis not present

## 2020-01-12 DIAGNOSIS — B9689 Other specified bacterial agents as the cause of diseases classified elsewhere: Secondary | ICD-10-CM | POA: Diagnosis not present

## 2020-01-12 MED ORDER — AMOXICILLIN-POT CLAVULANATE 875-125 MG PO TABS
1.0000 | ORAL_TABLET | Freq: Two times a day (BID) | ORAL | 0 refills | Status: DC
Start: 2020-01-12 — End: 2020-02-20

## 2020-01-12 NOTE — Progress Notes (Signed)
Virtual Visit via Video Note  I connected with Debra Gonzalez on 01/12/20 at 12:00 PM EDT by a video enabled telemedicine application and verified that I am speaking with the correct person using two identifiers.  Location: Patient: Home Provider:  Office   I discussed the limitations of evaluation and management by telemedicine and the availability of in person appointments. The patient expressed understanding and agreed to proceed.  History of Present Illness:  Pt reports headache, runny nose, nasal congestion, scratchy throat and cough. This started 2 weeks ago. The headache is located in her forehead. She describes the pain as pressure.  She is blowing yellow/green mucus out of her nose.  The cough is productive of yellow/green mucus.  She denies dizziness, visual changes, eye pain, eye redness or discharge.  She denies ear pain, sore throat, loss of taste/smell or shortness of breath.  She denies fever or chills but has had some body aches.  She is taking Zyrtec, Alka-Seltzer and Delsym with minimal relief of symptoms.  Past Medical History:  Diagnosis Date   Asthma    Hyperlipidemia    Reactive airway disease     Current Outpatient Medications  Medication Sig Dispense Refill   acetaminophen (TYLENOL) 500 MG tablet Take 500 mg by mouth every 6 (six) hours as needed.     albuterol (PROVENTIL HFA;VENTOLIN HFA) 108 (90 Base) MCG/ACT inhaler Inhale 2 puffs into the lungs every 6 (six) hours as needed for wheezing or shortness of breath.     albuterol (PROVENTIL) (2.5 MG/3ML) 0.083% nebulizer solution Take 3 mLs (2.5 mg total) by nebulization every 4 (four) hours as needed for wheezing or shortness of breath. 75 mL 12   b complex vitamins tablet Take 1 tablet by mouth daily.     Biotin 1000 MCG tablet Take 1,000 mcg by mouth 3 (three) times daily.     cetirizine (ZYRTEC) 10 MG tablet Take 10 mg by mouth daily.     Cholecalciferol (VITAMIN D-3) 5000 UNIT/ML LIQD Place under the  tongue.     cyclobenzaprine (FLEXERIL) 10 MG tablet Take 1 tablet (10 mg total) by mouth at bedtime as needed for muscle spasms. 20 tablet 0   fluticasone (FLONASE) 50 MCG/ACT nasal spray Place 2 sprays into both nostrils daily.     fluticasone (FLOVENT HFA) 110 MCG/ACT inhaler Inhale into the lungs 2 (two) times daily.     fluticasone furoate-vilanterol (BREO ELLIPTA) 100-25 MCG/INH AEPB Inhale 1 puff into the lungs daily. 28 each 11   ibuprofen (ADVIL) 200 MG tablet Take 200 mg by mouth every 6 (six) hours as needed.     Magnesium 100 MG TABS Take by mouth.     Multiple Vitamin (MULTIVITAMIN) tablet Take 1 tablet by mouth daily.     No current facility-administered medications for this visit.    Allergies  Allergen Reactions   Sulfa Antibiotics Nausea And Vomiting   Cinnamon     "eating too much causes lip swelling"   Codeine Nausea Only   Spinach Swelling    If 'eats too much gets lip swelling'   Wheat Bran     "eating too much causes lip swelling"    Family History  Problem Relation Age of Onset   Mitral valve prolapse Mother    Depression Mother    Hyperlipidemia Mother    Hypertension Mother    Melanoma Father    Hyperlipidemia Maternal Grandmother    Hypertension Maternal Grandmother    Diabetes Maternal Grandfather  Hyperlipidemia Paternal Grandmother    Stroke Paternal Grandmother    Bone cancer Paternal Grandfather     Social History   Socioeconomic History   Marital status: Divorced    Spouse name: Not on file   Number of children: Not on file   Years of education: Not on file   Highest education level: Not on file  Occupational History   Not on file  Tobacco Use   Smoking status: Never Smoker   Smokeless tobacco: Never Used  Vaping Use   Vaping Use: Never used  Substance and Sexual Activity   Alcohol use: Yes    Alcohol/week: 1.0 standard drink    Types: 1 Glasses of wine per week    Comment: weekly   Drug use:  No   Sexual activity: Not on file  Other Topics Concern   Not on file  Social History Narrative   Not on file   Social Determinants of Health   Financial Resource Strain:    Difficulty of Paying Living Expenses:   Food Insecurity:    Worried About Programme researcher, broadcasting/film/video in the Last Year:    Barista in the Last Year:   Transportation Needs:    Freight forwarder (Medical):    Lack of Transportation (Non-Medical):   Physical Activity:    Days of Exercise per Week:    Minutes of Exercise per Session:   Stress:    Feeling of Stress :   Social Connections:    Frequency of Communication with Friends and Family:    Frequency of Social Gatherings with Friends and Family:    Attends Religious Services:    Active Member of Clubs or Organizations:    Attends Engineer, structural:    Marital Status:   Intimate Partner Violence:    Fear of Current or Ex-Partner:    Emotionally Abused:    Physically Abused:    Sexually Abused:      Constitutional: Pt reports headache. Denies fever, malaise, fatigue, or abrupt weight changes.  HEENT: Pt reports runny nose, nasal congestion. Denies eye pain, eye redness, ear pain, ringing in the ears, wax buildup, bloody nose, or sore throat. Respiratory: Pt reports cough. Denies difficulty breathing, shortness of breath, or sputum production.   Cardiovascular: Denies chest pain, chest tightness, palpitations or swelling in the hands or feet.   No other specific complaints in a complete review of systems (except as listed in HPI above).  Observations/Objective: LMP  (LMP Unknown)  Wt Readings from Last 3 Encounters:  12/30/19 (!) 325 lb 6.4 oz (147.6 kg)  12/16/19 (!) 327 lb (148.3 kg)  12/12/19 (!) 327 lb (148.3 kg)    General: Appears herstated age, obese, in NAD. HEENT: Head: normal shape and size;Nose: congestion noted ; Throat/Mouth: hoarseness noted Pulmonary/Chest: Normal effort. No respiratory  distress.  Neurological: Alert and oriented.   BMET    Component Value Date/Time   NA 138 11/24/2019 2056   K 4.1 11/24/2019 2056   CL 104 11/24/2019 2056   CO2 22 11/24/2019 2056   GLUCOSE 116 (H) 11/24/2019 2056   BUN 14 11/24/2019 2056   CREATININE 0.75 11/24/2019 2056   CALCIUM 8.8 (L) 11/24/2019 2056   GFRNONAA >60 11/24/2019 2056   GFRAA >60 11/24/2019 2056    Lipid Panel  No results found for: CHOL, TRIG, HDL, CHOLHDL, VLDL, LDLCALC  CBC    Component Value Date/Time   WBC 7.7 11/24/2019 2056   RBC 4.34 11/24/2019  2056   HGB 14.2 11/24/2019 2056   HCT 40.7 11/24/2019 2056   PLT 289 11/24/2019 2056   MCV 93.8 11/24/2019 2056   MCH 32.7 11/24/2019 2056   MCHC 34.9 11/24/2019 2056   RDW 12.4 11/24/2019 2056    Hgb A1C No results found for: HGBA1C      Assessment and Plan:  Acute Bacterial Sinusitis:  Continue Zyrtec Add in Flonase RX for Augmentin 875-125 mg PO BID x 10 days  Return precautions discussed  Follow Up Instructions:    I discussed the assessment and treatment plan with the patient. The patient was provided an opportunity to ask questions and all were answered. The patient agreed with the plan and demonstrated an understanding of the instructions.   The patient was advised to call back or seek an in-person evaluation if the symptoms worsen or if the condition fails to improve as anticipated.     Nicki Reaper, NP

## 2020-01-12 NOTE — Patient Instructions (Signed)

## 2020-02-20 ENCOUNTER — Other Ambulatory Visit: Payer: Self-pay

## 2020-02-20 ENCOUNTER — Encounter: Payer: Self-pay | Admitting: Family Medicine

## 2020-02-20 ENCOUNTER — Ambulatory Visit: Payer: BC Managed Care – PPO | Admitting: Family Medicine

## 2020-02-20 VITALS — BP 110/80 | HR 85 | Temp 97.4°F | Ht 69.25 in | Wt 317.5 lb

## 2020-02-20 DIAGNOSIS — M549 Dorsalgia, unspecified: Secondary | ICD-10-CM

## 2020-02-20 DIAGNOSIS — R200 Anesthesia of skin: Secondary | ICD-10-CM | POA: Diagnosis not present

## 2020-02-20 DIAGNOSIS — M5416 Radiculopathy, lumbar region: Secondary | ICD-10-CM

## 2020-02-20 MED ORDER — CYCLOBENZAPRINE HCL 10 MG PO TABS: 10.0000 mg | ORAL_TABLET | Freq: Every evening | ORAL | 1 refills | Status: DC | PRN

## 2020-02-20 MED ORDER — PREDNISONE 20 MG PO TABS
ORAL_TABLET | ORAL | 0 refills | Status: DC
Start: 2020-02-20 — End: 2021-01-10

## 2020-02-20 MED ORDER — TRAMADOL HCL 50 MG PO TABS: 50.0000 mg | ORAL_TABLET | Freq: Three times a day (TID) | ORAL | 0 refills | Status: AC | PRN

## 2020-02-20 NOTE — Progress Notes (Signed)
Debra Gonzalez T. Debra Moretto, MD, CAQ Sports Medicine  Primary Care and Sports Medicine [facility] [address] [place], [address]  Phone: [phone]  FAX: [phone]  Debra Gonzalez - 54 y.o. female  MRN [id number]  Date of Birth: [birth year]  Date: 02/20/2020  PCP: Lorre Munroe, NP  Referral: Lorre Munroe, NP  Chief Complaint  Patient presents with  . Sciatica    This visit occurred during the SARS-CoV-2 public health emergency.  Safety protocols were in place, including screening questions prior to the visit, additional usage of staff PPE, and extensive cleaning of exam room while observing appropriate contact time as indicated for disinfecting solutions.   Subjective:   Debra Gonzalez is a 54 y.o. very pleasant female patient who presents with the following: Back Pain  ongoing for approximately: 2 weeks The patient has had back pain before and radiculopathy in the past.  Radiating all the lateral mall - all on the L.  Has been doing hot and cold 600 mg, tid to four a day. Flexeril at night. Works until 3 AM.  In the past did a couple of months.  Does do yoga also.  Has lost eighteen pounds.   Was walking about sixty minues and doing a stationary. A litle uncomfortable.   L lateral LE decreased sensatio  No numbness or tingling. No bowel or bladder incontinence. No focal weakness. Prior interventions: nsaids, flexeril, ice, heat Physical therapy: No Chiropractic manipulations: No Acupuncture: No Osteopathic manipulation: No Heat or cold: Minimal effect   Review of Systems is noted in the HPI, as appropriate  Objective:   Blood pressure 110/80, pulse 85, temperature (!) 97.4 F (36.3 C), temperature source Temporal, height 5' 9.25" (1.759 m), weight (!) 317 lb 8 oz (144 kg), SpO2 97 %.  GEN: No acute distress; alert,appropriate. PULM: Breathing comfortably in no respiratory distress PSYCH: Normally interactive.    Range of motion at  the waist: Flexion, rotation and lateral bending: She is limited in forward flexion to approximately 70 degrees.  Extension also causes pain.  Lateral bending and rotation are preserved.  No echymosis or edema Rises to examination table with no difficulty Gait: minimally antalgic  Inspection/Deformity: No abnormality Paraspinus T: Bilateral L4-S1 pain.  B Ankle Dorsiflexion (L5,4): 5/5 B Great Toe Dorsiflexion (L5,4): 5/5 Heel Walk (L5): WNL Toe Walk (S1): WNL Rise/Squat (L4): WNL, mild pain  SENSORY B Medial Foot (L4): WNL B Dorsum (L5): WNL B Lateral (S1): Left lateral decreased to soft touch as well as pinprick  REFLEXES Knee (L4): 2+ Ankle (S1): 2+  B SLR, seated: neg B SLR, supine: neg B FABER: neg B Reverse FABER: neg B Greater Troch: NT B Log Roll: neg B Stork: NT B Sciatic Notch: NT  Radiology: No results found.  Assessment and Plan:     ICD-10-CM   1. Lumbar radiculopathy, acute  M54.16   2. Acute back pain, unspecified back location, unspecified back pain laterality  M54.9   3. Numbness in left leg  R20.0    Acute on chronic low back pain with radiculopathy and numbness in the left lateral extremity.  Lower.  Conservative algorithms for acute back pain generally begin with the following: NSAIDS, Muscle Relaxants, Mild pain medication.  In this case with radicular symptoms and lower extremity numbness, pulse with steroids in addition. Start with medications, core rehab, and progress from there following low back pain algorithm. No red flags are present.  Follow-up: Return  follow-up in 1 month if not getting better.  Meds ordered this encounter  Medications  . cyclobenzaprine (FLEXERIL) 10 MG tablet    Sig: Take 1 tablet (10 mg total) by mouth at bedtime as needed for muscle spasms.    Dispense:  30 tablet    Refill:  1  . predniSONE (DELTASONE) 20 MG tablet    Sig: 2 tabs po daily for 5 days, then 1 tab po daily for 5 days     Dispense:  15 tablet    Refill:  0  . traMADol (ULTRAM) 50 MG tablet    Sig: Take 1 tablet (50 mg total) by mouth every 8 (eight) hours as needed for up to 5 days for moderate pain.    Dispense:  20 tablet    Refill:  0   Medications Discontinued During This Encounter  Medication Reason  . amoxicillin-clavulanate (AUGMENTIN) 875-125 MG tablet Completed Course  . fluticasone furoate-vilanterol (BREO ELLIPTA) 100-25 MCG/INH AEPB Completed Course  . fluticasone (FLOVENT HFA) 110 MCG/ACT inhaler Completed Course  . cyclobenzaprine (FLEXERIL) 10 MG tablet Reorder   No orders of the defined types were placed in this encounter.   Signed,  Debra Galea. Shaquela Weichert, MD   Outpatient Encounter Medications as of 02/20/2020  Medication Sig  . acetaminophen (TYLENOL) 500 MG tablet Take 500 mg by mouth every 6 (six) hours as needed.  Marland Kitchen albuterol (PROVENTIL HFA;VENTOLIN HFA) 108 (90 Base) MCG/ACT inhaler Inhale 2 puffs into the lungs every 6 (six) hours as needed for wheezing or shortness of breath.  Marland Kitchen albuterol (PROVENTIL) (2.5 MG/3ML) 0.083% nebulizer solution Take 3 mLs (2.5 mg total) by nebulization every 4 (four) hours as needed for wheezing or shortness of breath.  Marland Kitchen b complex vitamins tablet Take 1 tablet by mouth daily.  . Biotin 1000 MCG tablet Take 1,000 mcg by mouth 3 (three) times daily.  . cetirizine (ZYRTEC) 10 MG tablet Take 10 mg by mouth daily.  . Cholecalciferol (VITAMIN D-3) 5000 UNIT/ML LIQD Place under the tongue.  . cyclobenzaprine (FLEXERIL) 10 MG tablet Take 1 tablet (10 mg total) by mouth at bedtime as needed for muscle spasms.  . fluticasone (FLONASE) 50 MCG/ACT nasal spray Place 2 sprays into both nostrils daily.  Marland Kitchen ibuprofen (ADVIL) 200 MG tablet Take 200 mg by mouth every 6 (six) hours as needed.  . Magnesium 100 MG TABS Take by mouth.  . Multiple Vitamin (MULTIVITAMIN) tablet Take 1 tablet by mouth daily.  . [DISCONTINUED] cyclobenzaprine (FLEXERIL) 10 MG tablet Take 1  tablet (10 mg total) by mouth at bedtime as needed for muscle spasms.  . predniSONE (DELTASONE) 20 MG tablet 2 tabs po daily for 5 days, then 1 tab po daily for 5 days  . traMADol (ULTRAM) 50 MG tablet Take 1 tablet (50 mg total) by mouth every 8 (eight) hours as needed for up to 5 days for moderate pain.  . [DISCONTINUED] amoxicillin-clavulanate (AUGMENTIN) 875-125 MG tablet Take 1 tablet by mouth 2 (two) times daily.  . [DISCONTINUED] fluticasone (FLOVENT HFA) 110 MCG/ACT inhaler Inhale into the lungs 2 (two) times daily. (Patient not taking: Reported on 01/12/2020)  . [DISCONTINUED] fluticasone furoate-vilanterol (BREO ELLIPTA) 100-25 MCG/INH AEPB Inhale 1 puff into the lungs daily. (Patient not taking: Reported on 01/12/2020)   No facility-administered encounter medications on file as of 02/20/2020.

## 2020-03-30 ENCOUNTER — Other Ambulatory Visit: Payer: BC Managed Care – PPO

## 2020-04-02 ENCOUNTER — Ambulatory Visit: Payer: BC Managed Care – PPO

## 2020-06-25 DIAGNOSIS — N841 Polyp of cervix uteri: Secondary | ICD-10-CM | POA: Diagnosis not present

## 2020-06-25 DIAGNOSIS — Z1231 Encounter for screening mammogram for malignant neoplasm of breast: Secondary | ICD-10-CM | POA: Diagnosis not present

## 2020-06-25 DIAGNOSIS — Z6841 Body Mass Index (BMI) 40.0 and over, adult: Secondary | ICD-10-CM | POA: Diagnosis not present

## 2020-06-25 DIAGNOSIS — Z01419 Encounter for gynecological examination (general) (routine) without abnormal findings: Secondary | ICD-10-CM | POA: Diagnosis not present

## 2020-06-25 LAB — HM MAMMOGRAPHY

## 2020-06-25 LAB — HM PAP SMEAR: HM Pap smear: NEGATIVE

## 2020-07-30 DIAGNOSIS — R87618 Other abnormal cytological findings on specimens from cervix uteri: Secondary | ICD-10-CM | POA: Diagnosis not present

## 2021-01-08 ENCOUNTER — Encounter: Payer: Self-pay | Admitting: Medical

## 2021-01-08 ENCOUNTER — Other Ambulatory Visit: Payer: Self-pay

## 2021-01-08 ENCOUNTER — Ambulatory Visit: Payer: Self-pay | Admitting: Medical

## 2021-01-08 VITALS — BP 142/86 | HR 110 | Temp 98.4°F | Resp 18 | Wt 324.4 lb

## 2021-01-08 DIAGNOSIS — R52 Pain, unspecified: Secondary | ICD-10-CM

## 2021-01-08 DIAGNOSIS — R6889 Other general symptoms and signs: Secondary | ICD-10-CM

## 2021-01-08 DIAGNOSIS — R0981 Nasal congestion: Secondary | ICD-10-CM

## 2021-01-08 DIAGNOSIS — Z20822 Contact with and (suspected) exposure to covid-19: Secondary | ICD-10-CM

## 2021-01-08 DIAGNOSIS — R5383 Other fatigue: Secondary | ICD-10-CM

## 2021-01-08 DIAGNOSIS — R0982 Postnasal drip: Secondary | ICD-10-CM

## 2021-01-08 DIAGNOSIS — Z1152 Encounter for screening for COVID-19: Secondary | ICD-10-CM

## 2021-01-08 LAB — POCT INFLUENZA A/B
Influenza A, POC: NEGATIVE
Influenza B, POC: NEGATIVE

## 2021-01-08 LAB — POC COVID19 BINAXNOW: SARS Coronavirus 2 Ag: NEGATIVE

## 2021-01-08 NOTE — Patient Instructions (Signed)
COVID-19: What to Do if You Are Sick CDC has updated isolation and quarantine recommendations for the public, and is revising the CDC website to reflect these changes. These recommendations do not apply to healthcare personnel and do not supersede state, local, tribal, or territorial laws, rules, andregulations. If you have a fever, cough or other symptoms, you might have COVID-19. Most people have mild illness and are able to recover at home. If you are sick: Keep track of your symptoms. If you have an emergency warning sign (including trouble breathing), call 911. Steps to help prevent the spread of COVID-19 if you are sick If you are sick with COVID-19 or think you might have COVID-19, follow the steps below to care for yourself and to help protect other peoplein your home and community. Stay home except to get medical care Stay home. Most people with COVID-19 have mild illness and can recover at home without medical care. Do not leave your home, except to get medical care. Do not visit public areas. Take care of yourself. Get rest and stay hydrated. Take over-the-counter medicines, such as acetaminophen, to help you feel better. Stay in touch with your doctor. Call before you get medical care. Be sure to get care if you have trouble breathing, or have any other emergency warning signs, or if you think it is an emergency. Avoid public transportation, ride-sharing, or taxis. Separate yourself from other people As much as possible, stay in a specific room and away from other people and pets in your home. If possible, you should use a separate bathroom. If you need to be around other people or animals in oroutside of the home, wear a mask. Tell your close contactsthat they may have been exposed to COVID-19. An infected person can spread COVID-19 starting 48 hours (or 2 days) before the person has any symptoms or tests positive. By letting your close contacts know they may have been exposed to COVID-19,  you are helping to protect everyone. Additional guidance is available for those living in close quarters and shared housing. See COVID-19 and Animals if you have questions about pets. If you are diagnosed with COVID-19, someone from the health department may call you. Answer the call to slow the spread. Monitor your symptoms Symptoms of COVID-19 include fever, cough, or other symptoms. Follow care instructions from your healthcare provider and local health department. Your local health authorities may give instructions on checking your symptoms and reporting information. When to seek emergency medical attention Look for emergency warning signs* for COVID-19. If someone is showing any of these signs, seek emergency medical care immediately: Trouble breathing Persistent pain or pressure in the chest New confusion Inability to wake or stay awake Pale, gray, or blue-colored skin, lips, or nail beds, depending on skin tone *This list is not all possible symptoms. Please call your medical provider forany other symptoms that are severe or concerning to you. Call 911 or call ahead to your local emergency facility: Notify the operator that you are seeking care for someone who has or may haveCOVID-19. Call ahead before visiting your doctor Call ahead. Many medical visits for routine care are being postponed or done by phone or telemedicine. If you have a medical appointment that cannot be postponed, call your doctor's office, and tell them you have or may have COVID-19. This will help the office protect themselves and other patients. Get tested If you have symptoms of COVID-19, get tested. While waiting for test results, you stay away from others,   including staying apart from those living in your household. Self-tests are one of several options for testing for the virus that causes COVID-19 and may be more convenient than laboratory-based tests and point-of-care tests. Ask your healthcare provider or  your local health department if you need help interpreting your test results. You can visit your state, tribal, local, and territorial health department's website to look for the latest local information on testing sites. If you are sick, wear a mask over your nose and mouth You should wear a mask over your nose and mouth if you must be around other people or animals, including pets (even at home). You don't need to wear the mask if you are alone. If you can't put on a mask (because of trouble breathing, for example), cover your coughs and sneezes in some other way. Try to stay at least 6 feet away from other people. This will help protect the people around you. Masks should not be placed on young children under age 2 years, anyone who has trouble breathing, or anyone who is not able to remove the mask without help. Note: During the COVID-19 pandemic, medical grade facemasks are reserved forhealthcare workers and some first responders. Cover your coughs and sneezes Cover your mouth and nose with a tissue when you cough or sneeze. Throw away used tissues in a lined trash can. Immediately wash your hands with soap and water for at least 20 seconds. If soap and water are not available, clean your hands with an alcohol-based hand sanitizer that contains at least 60% alcohol. Clean your hands often Wash your hands often with soap and water for at least 20 seconds. This is especially important after blowing your nose, coughing, or sneezing; going to the bathroom; and before eating or preparing food. Use hand sanitizer if soap and water are not available. Use an alcohol-based hand sanitizer with at least 60% alcohol, covering all surfaces of your hands and rubbing them together until they feel dry. Soap and water are the best option, especially if hands are visibly dirty. Avoid touching your eyes, nose, and mouth with unwashed hands. Handwashing Tips Avoid sharing personal household items Do not share  dishes, drinking glasses, cups, eating utensils, towels, or bedding with other people in your home. Wash these items thoroughly after using them with soap and water or put in the dishwasher. Clean all "high-touch" surfaces every day Clean and disinfect high-touch surfaces in your "sick room" and bathroom; wear disposable gloves. Let someone else clean and disinfect surfaces in common areas, but you should clean your bedroom and bathroom, if possible. If a caregiver or other person needs to clean and disinfect a sick person's bedroom or bathroom, they should do so on an as-needed basis. The caregiver/other person should wear a mask and disposable gloves prior to cleaning. They should wait as long as possible after the person who is sick has used the bathroom before coming in to clean and use the bathroom. High-touch surfaces include phones, remote controls, counters, tabletops, doorknobs, bathroom fixtures, toilets, keyboards, tablets, and bedside tables. Clean and disinfect areas that may have blood, stool, or body fluids on them. Use household cleaners and disinfectants. Clean the area or item with soap and water or another detergent if it is dirty. Then, use a household disinfectant. Be sure to follow the instructions on the label to ensure safe and effective use of the product. Many products recommend keeping the surface wet for several minutes to ensure germs are killed. Many   also recommend precautions such as wearing gloves and making sure you have good ventilation during use of the product. Use a product from EPA's List N: Disinfectants for Coronavirus (COVID-19). Complete Disinfection Guidance When you can be around others after being sick with COVID-19 Deciding when you can be around others is different for different situations. Find out when you can safely end home isolation. For any additional questions about your care,contact your healthcare provider or state or local health  department. 06/13/2020 Content source: National Center for Immunization and Respiratory Diseases (NCIRD), Division of Viral Diseases This information is not intended to replace advice given to you by your health care provider. Make sure you discuss any questions you have with your healthcare provider. Document Revised: 08/10/2020 Document Reviewed: 08/10/2020 Elsevier Patient Education  2022 Elsevier Inc.  

## 2021-01-08 NOTE — Progress Notes (Addendum)
Subjective:    Patient ID: Debra Gonzalez, female    DOB: 21-Dec-1965, 55 y.o.   MRN: [id number]  HPI 55 yo female  in non acute distress, Starting on Friday night  not feeling well, then Saturday with  Scratchy throat. Denies fever or chills, wheezing or chest pain. Some SOB with going outside in high humidity, like walking outside. Has reactive airway disease has not tried to use inhaler. Using OTC Alka Seltzer  cold and sinus.as on package.  Took Covid-19  home test last night, negative.  Takes Zyrtec and Flonase and then albuterol , neb tx as needed.  Pfizer vaccinated and one booster Blood pressure (!) 142/86, pulse (!) 110, temperature 98.4 F (36.9 C), temperature source Tympanic, resp. rate 18, weight (!) 324 lb 6.4 oz (147.1 kg), SpO2 95 %.  Allergies  Allergen Reactions   Sulfa Antibiotics Nausea And Vomiting   Cinnamon     "eating too much causes lip swelling"   Codeine Nausea Only   Spinach Swelling    If 'eats too much gets lip swelling'   Wheat Bran     "eating too much causes lip swelling"   Past Medical History:  Diagnosis Date   Asthma    Hyperlipidemia    Reactive airway disease      Review of Systems  Constitutional:  Negative for chills and fever.  HENT:  Positive for congestion, postnasal drip, rhinorrhea, sinus pressure, sinus pain, sneezing and sore throat (scsatchty only). Negative for trouble swallowing.        Ears feels full  Respiratory:  Positive for chest tightness (a little bit) and shortness of breath (has Asthma,  has not used inhaler). Negative for cough.   Cardiovascular:  Negative for chest pain.  Gastrointestinal:  Negative for abdominal pain and diarrhea.  Musculoskeletal:  Positive for myalgias.  Skin:  Negative for color change.  Allergic/Immunologic: Positive for environmental allergies.  Neurological:  Positive for dizziness (since Saturday night) and headaches (this am). Negative for syncope and weakness.  Hematological:  Negative  for adenopathy.      Objective:   Physical Exam Constitutional:      Appearance: Normal appearance. She is obese.  HENT:     Head: Normocephalic and atraumatic.     Right Ear: Ear canal and external ear normal. A middle ear effusion is present.     Left Ear: Ear canal and external ear normal. A middle ear effusion is present.     Mouth/Throat:     Mouth: Mucous membranes are moist.     Pharynx: Posterior oropharyngeal erythema (uvula vascular looking and swollen, no swelling of  tonsils) present. No oropharyngeal exudate.  Eyes:     Extraocular Movements: Extraocular movements intact.     Conjunctiva/sclera: Conjunctivae normal.     Pupils: Pupils are equal, round, and reactive to light.  Cardiovascular:     Rate and Rhythm: Normal rate and regular rhythm.     Pulses: Normal pulses.     Heart sounds: Normal heart sounds.  Pulmonary:     Effort: Pulmonary effort is normal.     Breath sounds: Normal breath sounds.  Lymphadenopathy:     Cervical: Cervical adenopathy present.  Neurological:     General: No focal deficit present.     Mental Status: She is alert and oriented to person, place, and time.  Psychiatric:        Mood and Affect: Mood normal.        Behavior: Behavior normal.  Thought Content: Thought content normal.        Judgment: Judgment normal.    Results for orders placed or performed in visit on 01/08/21 (from the past 24 hour(s))  POC COVID-19     Status: Normal   Collection Time: 01/08/21  1:49 PM  Result Value Ref Range   SARS Coronavirus 2 Ag Negative Negative  POCT Influenza A/B     Status: Normal   Collection Time: 01/08/21  1:49 PM  Result Value Ref Range   Influenza A, POC Negative Negative   Influenza B, POC Negative Negative       PCR Covid-19  Pending Assessment & Plan:  Bodyaches, HA, scratchy throat PND and nasal congestion Isolate ( lives alone) ( patient does live alone), rest , increase fluids.OTC Tylenol jor Ibuprofen for fever or  aches per package instructions. Work note completed, expect  3 days on PCR results. May possibly RTW after speaking with me. Recommended a change in non sedative antihistimine to Claritin ( per package instructions) and stop Zyrtec for the next month in case she has built up a tolerance to Zyrtec. All symptoms must be improving and no fever x 24 hours with no OTC Motrin or Tylenol. Monitor fever if possible.  Patient verbalizes understanding and has no questions at discharge.

## 2021-01-10 ENCOUNTER — Other Ambulatory Visit: Payer: Self-pay | Admitting: Nurse Practitioner

## 2021-01-10 ENCOUNTER — Other Ambulatory Visit: Payer: Self-pay

## 2021-01-10 ENCOUNTER — Telehealth: Payer: BC Managed Care – PPO | Admitting: Nurse Practitioner

## 2021-01-10 ENCOUNTER — Telehealth: Payer: Self-pay | Admitting: Nurse Practitioner

## 2021-01-10 DIAGNOSIS — J019 Acute sinusitis, unspecified: Secondary | ICD-10-CM

## 2021-01-10 LAB — NOVEL CORONAVIRUS, NAA: SARS-CoV-2, NAA: NOT DETECTED

## 2021-01-10 LAB — SARS-COV-2, NAA 2 DAY TAT

## 2021-01-10 MED ORDER — PREDNISONE 20 MG PO TABS: 20.0000 mg | ORAL_TABLET | Freq: Two times a day (BID) | ORAL | 0 refills | Status: AC

## 2021-01-10 MED ORDER — AMOXICILLIN-POT CLAVULANATE 875-125 MG PO TABS: 1.0000 | ORAL_TABLET | Freq: Two times a day (BID) | ORAL | 0 refills | Status: AC

## 2021-01-10 NOTE — Progress Notes (Signed)
See previous office visit.   Spoke with patient she has continued to use Dayquil with Mucinex and Nyquil.   Now has a productive cough with yellow mucous  She has been doing Claritin and Flonase in addition to OTC cold medications.  Was seen in office earlier this week for COVID and flu testing both negative, today her PCR for COVID-19 returned negative as well.   She feels now that this has progressed to a sinus infection that she does get once a year on average usually in the spring.   She typically starts with allergies that progress to sinusitis.   She has a rectory airway and keep an albuterol inhaler at home, she has not needed to use it so far.   Recent Results (from the past 2160 hour(s))  Novel Coronavirus, NAA ([facility])     Status: None   Collection Time: 01/08/21  1:41 PM   Specimen: Nasopharyngeal(NP) swabs in vial transport medium   Nasopharynge  Result Value Ref Range   SARS-CoV-2, NAA Not Detected Not Detected    Comment: This nucleic acid amplification test was developed and its performance characteristics determined by [employer]. Nucleic acid amplification tests include RT-PCR and TMA. This test has not been FDA cleared or approved. This test has been authorized by FDA under an Emergency Use Authorization (EUA). This test is only authorized for the duration of time the declaration that circumstances exist justifying the authorization of the emergency use of in vitro diagnostic tests for detection of SARS-CoV-2 virus and/or diagnosis of COVID-19 infection under section 564(b)(1) of the Act, 21 U.S.C. 810FBP-1(W) (1), unless the authorization is terminated or revoked sooner. When diagnostic testing is negative, the possibility of a false negative result should be considered in the context of a patient's recent exposures and the presence of clinical signs and symptoms consistent with COVID-19. An individual without symptoms of COVID-19 and who is not  shedding SARS-CoV-2 virus wo uld expect to have a negative (not detected) result in this assay.   SARS-COV-2, NAA 2 DAY TAT     Status: None   Collection Time: 01/08/21  1:41 PM   Nasopharynge  Result Value Ref Range   SARS-CoV-2, NAA 2 DAY TAT Performed   POC COVID-19     Status: Normal   Collection Time: 01/08/21  1:49 PM  Result Value Ref Range   SARS Coronavirus 2 Ag Negative Negative  POCT Influenza A/B     Status: Normal   Collection Time: 01/08/21  1:49 PM  Result Value Ref Range   Influenza A, POC Negative Negative   Influenza B, POC Negative Negative     Discussed with patient will start antibiotics, and advised inhaler use every 4-6 hours as needed.   If cough persists with wheezing may start prednisone after 24-48 hours of inhaler use without relief, earlier with any worsening symptoms.   Current Outpatient Medications  Medication Instructions   acetaminophen (TYLENOL) 500 mg, Oral, Every 6 hours PRN   albuterol (PROVENTIL HFA;VENTOLIN HFA) 108 (90 Base) MCG/ACT inhaler 2 puffs, Inhalation, Every 6 hours PRN   albuterol (PROVENTIL) 2.5 mg, Nebulization, Every 4 hours PRN   amoxicillin-clavulanate (AUGMENTIN) 875-125 MG tablet 1 tablet, Oral, 2 times daily, Take with food   b complex vitamins tablet 1 tablet, Oral, Daily   Biotin 1,000 mcg, Oral, 3 times daily   cetirizine (ZYRTEC) 10 mg, Oral, Daily   Cholecalciferol (VITAMIN D-3) 5000 UNIT/ML LIQD Sublingual   cyclobenzaprine (FLEXERIL) 10 mg, Oral, At  bedtime PRN   fluticasone (FLONASE) 50 MCG/ACT nasal spray 2 sprays, Each Nare, Daily   ibuprofen (ADVIL) 200 mg, Oral, Every 6 hours PRN   Magnesium 100 MG TABS Oral   Multiple Vitamin (MULTIVITAMIN) tablet 1 tablet, Oral, Daily   predniSONE (DELTASONE) 20 mg, Oral, 2 times daily with meals     Follow up if symptoms persist with new or worsening symptoms as discussed.   Sinusitis - Augmentin twice daily for 7 days  Cough start inhaler continue for 24-48 hours  and start prednisone if no relief or with worsening symptoms   Advised to seek immediate follow up with any acutely worsening symptoms

## 2021-01-10 NOTE — Telephone Encounter (Signed)
PCR for COVID-19 negative

## 2021-01-14 ENCOUNTER — Encounter: Payer: Self-pay | Admitting: Medical

## 2021-01-18 ENCOUNTER — Telehealth: Payer: BC Managed Care – PPO | Admitting: Adult Health

## 2021-01-18 ENCOUNTER — Other Ambulatory Visit: Payer: Self-pay

## 2021-01-18 DIAGNOSIS — J019 Acute sinusitis, unspecified: Secondary | ICD-10-CM

## 2021-01-18 MED ORDER — AMOXICILLIN-POT CLAVULANATE 875-125 MG PO TABS: 1.0000 | ORAL_TABLET | Freq: Two times a day (BID) | ORAL | 0 refills | Status: DC

## 2021-01-18 NOTE — Progress Notes (Signed)
[facility] [address] [place], [place] [address]   Office Visit Note  Patient Name: Debra Gonzalez  [id number]  [id number]  Date of Service: 01/18/2021  Chief Complaint  Patient presents with   Sinusitis      HPI Pt is seen virtually for sick visit.  She reports she just completed a 7 day course of Augmentin, and 5 days of prednisone taper. She felt pretty good yesterday, but today she woke up and felt like she was worse.  She denies any fever or chills in the last few days.   Current Medication:  Outpatient Encounter Medications as of 01/18/2021  Medication Sig   amoxicillin-clavulanate (AUGMENTIN) 875-125 MG tablet Take 1 tablet by mouth 2 (two) times daily.   acetaminophen (TYLENOL) 500 MG tablet Take 500 mg by mouth every 6 (six) hours as needed.   albuterol (PROVENTIL HFA;VENTOLIN HFA) 108 (90 Base) MCG/ACT inhaler Inhale 2 puffs into the lungs every 6 (six) hours as needed for wheezing or shortness of breath.   albuterol (PROVENTIL) (2.5 MG/3ML) 0.083% nebulizer solution Take 3 mLs (2.5 mg total) by nebulization every 4 (four) hours as needed for wheezing or shortness of breath.   b complex vitamins tablet Take 1 tablet by mouth daily.   Biotin 1000 MCG tablet Take 1,000 mcg by mouth 3 (three) times daily.   cetirizine (ZYRTEC) 10 MG tablet Take 10 mg by mouth daily.   Cholecalciferol (VITAMIN D-3) 5000 UNIT/ML LIQD Place under the tongue.   cyclobenzaprine (FLEXERIL) 10 MG tablet Take 1 tablet (10 mg total) by mouth at bedtime as needed for muscle spasms.   fluticasone (FLONASE) 50 MCG/ACT nasal spray Place 2 sprays into both nostrils daily.   ibuprofen (ADVIL) 200 MG tablet Take 200 mg by mouth every 6 (six) hours as needed.   Magnesium 100 MG TABS Take by mouth.   Multiple Vitamin (MULTIVITAMIN) tablet Take 1 tablet by mouth daily.   No facility-administered encounter medications on file as of 01/18/2021.      Medical History: Past Medical History:   Diagnosis Date   Asthma    Hyperlipidemia    Reactive airway disease      Vital Signs: LMP  (LMP Unknown)    Review of Systems  Constitutional:  Positive for fatigue.  HENT:  Positive for congestion.    Physical Exam Virtual visit, no physical exam.   Assessment/Plan: 1. Acute non-recurrent sinusitis, unspecified location Extended Augmentin for 4 more days.  IF symptoms fail to improve, follow up in office.  - amoxicillin-clavulanate (AUGMENTIN) 875-125 MG tablet; Take 1 tablet by mouth 2 (two) times daily.  Dispense: 8 tablet; Refill: 0     General Counseling: Debra Gonzalez verbalizes understanding of the findings of todays visit and agrees with plan of treatment. I have discussed any further diagnostic evaluation that may be needed or ordered today. We also reviewed her medications today. she has been encouraged to call the office with any questions or concerns that should arise related to todays visit.   No orders of the defined types were placed in this encounter.   Meds ordered this encounter  Medications   amoxicillin-clavulanate (AUGMENTIN) 875-125 MG tablet    Sig: Take 1 tablet by mouth 2 (two) times daily.    Dispense:  8 tablet    Refill:  0    Time spent:15 Minutes    Johnna Acosta AGNP-C Nurse Practitioner

## 2021-04-04 ENCOUNTER — Encounter: Payer: Self-pay | Admitting: Nurse Practitioner

## 2021-04-04 ENCOUNTER — Ambulatory Visit: Payer: BC Managed Care – PPO | Admitting: Nurse Practitioner

## 2021-04-04 ENCOUNTER — Other Ambulatory Visit: Payer: Self-pay

## 2021-04-04 VITALS — BP 130/94 | HR 95 | Temp 98.1°F | Resp 16

## 2021-04-04 DIAGNOSIS — R059 Cough, unspecified: Secondary | ICD-10-CM

## 2021-04-04 DIAGNOSIS — J0100 Acute maxillary sinusitis, unspecified: Secondary | ICD-10-CM

## 2021-04-04 LAB — POC COVID19 BINAXNOW: SARS Coronavirus 2 Ag: NEGATIVE

## 2021-04-04 MED ORDER — AMOXICILLIN-POT CLAVULANATE 875-125 MG PO TABS: 1.0000 | ORAL_TABLET | Freq: Two times a day (BID) | ORAL | 0 refills | Status: AC

## 2021-04-04 MED ORDER — ALBUTEROL SULFATE HFA 108 (90 BASE) MCG/ACT IN AERS: 2.0000 | INHALATION_SPRAY | Freq: Four times a day (QID) | RESPIRATORY_TRACT | 0 refills | Status: DC | PRN

## 2021-04-04 NOTE — Progress Notes (Signed)
Subjective:    Patient ID: Debra Gonzalez, female    DOB: 1965-08-18, 55 y.o.   MRN: [id number]  HPI  55 year old female presenting to [facility] with complaints of sinus congestion increasing fatigue right ear pain with PND. She has been using flonase.   History of reactive airway that is causing a cough with wheeze.   Covid vaccine x3    Past Medical History:  Diagnosis Date   Asthma    Hyperlipidemia    Reactive airway disease        Review of Systems  Constitutional:  Positive for fatigue.  HENT:  Positive for ear pain, rhinorrhea and sinus pressure.   Eyes: Negative.   Respiratory:  Positive for cough.   Cardiovascular: Negative.   Gastrointestinal: Negative.   Genitourinary: Negative.   Musculoskeletal: Negative.   Neurological: Negative.   Hematological: Negative.       Objective:   Physical Exam Constitutional:      Appearance: Normal appearance.  HENT:     Head: Normocephalic.     Right Ear: Hearing normal. A middle ear effusion is present. Tympanic membrane is not erythematous or bulging.     Left Ear: Hearing normal. A middle ear effusion is present. Tympanic membrane is not erythematous or bulging.     Nose: Congestion present.     Right Sinus: Frontal sinus tenderness present.     Left Sinus: Frontal sinus tenderness present.     Mouth/Throat:     Mouth: Mucous membranes are moist.     Comments: PND noted Eyes:     Pupils: Pupils are equal, round, and reactive to light.  Cardiovascular:     Rate and Rhythm: Normal rate and regular rhythm.     Heart sounds: Normal heart sounds.  Pulmonary:     Effort: Pulmonary effort is normal.     Breath sounds: Wheezing present. No rhonchi.  Skin:    General: Skin is warm.  Neurological:     General: No focal deficit present.     Mental Status: She is alert.  Psychiatric:        Mood and Affect: Mood normal.     Recent Results (from the past 2160 hour(s))  Novel Coronavirus, NAA  ([facility])     Status: None   Collection Time: 01/08/21  1:41 PM   Specimen: Nasopharyngeal(NP) swabs in vial transport medium   Nasopharynge  Result Value Ref Range   SARS-CoV-2, NAA Not Detected Not Detected    Comment: This nucleic acid amplification test was developed and its performance characteristics determined by [employer]. Nucleic acid amplification tests include RT-PCR and TMA. This test has not been FDA cleared or approved. This test has been authorized by FDA under an Emergency Use Authorization (EUA). This test is only authorized for the duration of time the declaration that circumstances exist justifying the authorization of the emergency use of in vitro diagnostic tests for detection of SARS-CoV-2 virus and/or diagnosis of COVID-19 infection under section 564(b)(1) of the Act, 21 U.S.C. 876OTL-5(B) (1), unless the authorization is terminated or revoked sooner. When diagnostic testing is negative, the possibility of a false negative result should be considered in the context of a patient's recent exposures and the presence of clinical signs and symptoms consistent with COVID-19. An individual without symptoms of COVID-19 and who is not shedding SARS-CoV-2 virus wo uld expect to have a negative (not detected) result in this assay.   SARS-COV-2, NAA 2 DAY TAT  Status: None   Collection Time: 01/08/21  1:41 PM   Nasopharynge  Result Value Ref Range   SARS-CoV-2, NAA 2 DAY TAT Performed   POC COVID-19     Status: Normal   Collection Time: 01/08/21  1:49 PM  Result Value Ref Range   SARS Coronavirus 2 Ag Negative Negative  POCT Influenza A/B     Status: Normal   Collection Time: 01/08/21  1:49 PM  Result Value Ref Range   Influenza A, POC Negative Negative   Influenza B, POC Negative Negative  POC COVID-19     Status: Normal   Collection Time: 04/04/21  3:22 PM  Result Value Ref Range   SARS Coronavirus 2 Ag Negative Negative    Comment: Patient aware  of neg POC. Has appt with S.Jacobie Stamey FNP for further eval.        Assessment & Plan:  1. Cough  - POC COVID-19 Rapid Negative  - Novel Coronavirus, NAA ([facility])  -PCR negative   - albuterol (VENTOLIN HFA) 108 (90 Base) MCG/ACT inhaler; Inhale 2 puffs into the lungs every 6 (six) hours as needed for wheezing or shortness of breath.  Dispense: 8 g; Refill: 0  2. Acute non-recurrent maxillary sinusitis  - amoxicillin-clavulanate (AUGMENTIN) 875-125 MG tablet; Take 1 tablet by mouth 2 (two) times daily for 7 days. Take with food  Dispense: 14 tablet; Refill: 0     Advised to continue OTC cough support and decongestant. Mucinex. Push fluids rest. RTC if symptoms persist or with new concerns as discussed

## 2021-04-06 LAB — NOVEL CORONAVIRUS, NAA: SARS-CoV-2, NAA: NOT DETECTED

## 2021-04-06 LAB — SARS-COV-2, NAA 2 DAY TAT

## 2021-04-08 ENCOUNTER — Encounter: Payer: Self-pay | Admitting: Nurse Practitioner

## 2021-04-12 IMAGING — DX DG RIBS W/ CHEST 3+V*L*
5 series · 5 of 5 positions shown · non-contrast
Comparison: 11/24/2019

CLINICAL DATA: Chronic cough, left-sided rib pain

EXAM:
LEFT RIBS AND CHEST - 3+ VIEW

[chest pa]
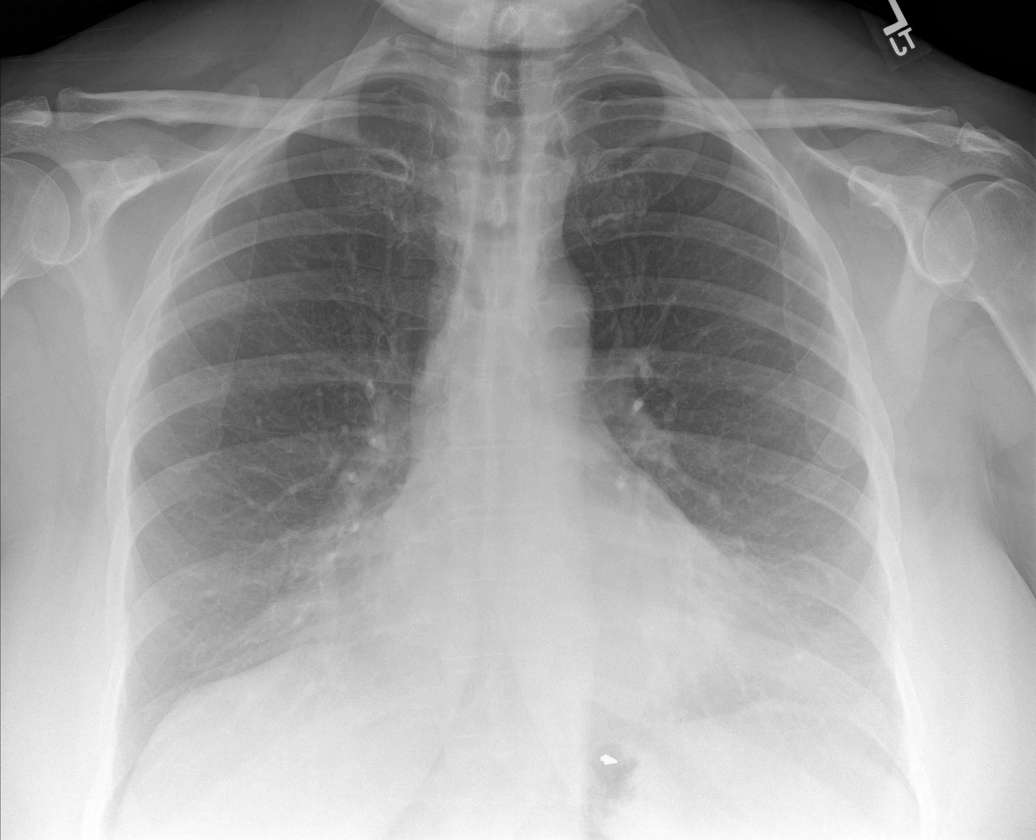

[rib obl (1 of 2)]
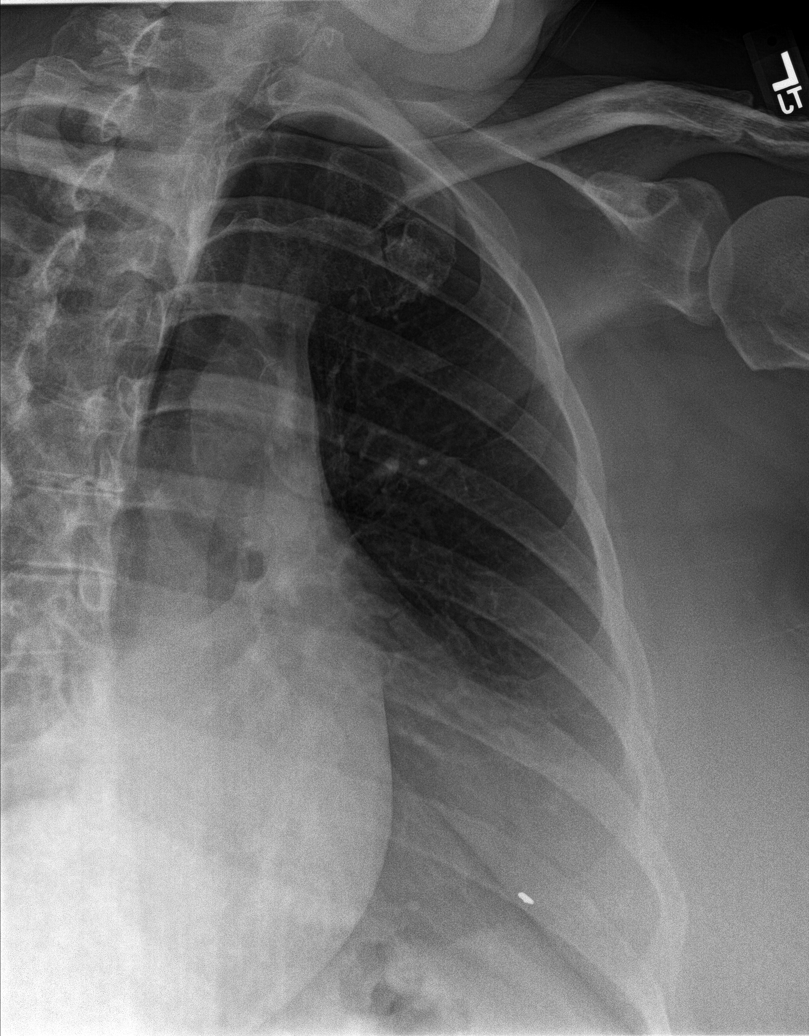

[rib obl (2 of 2)]
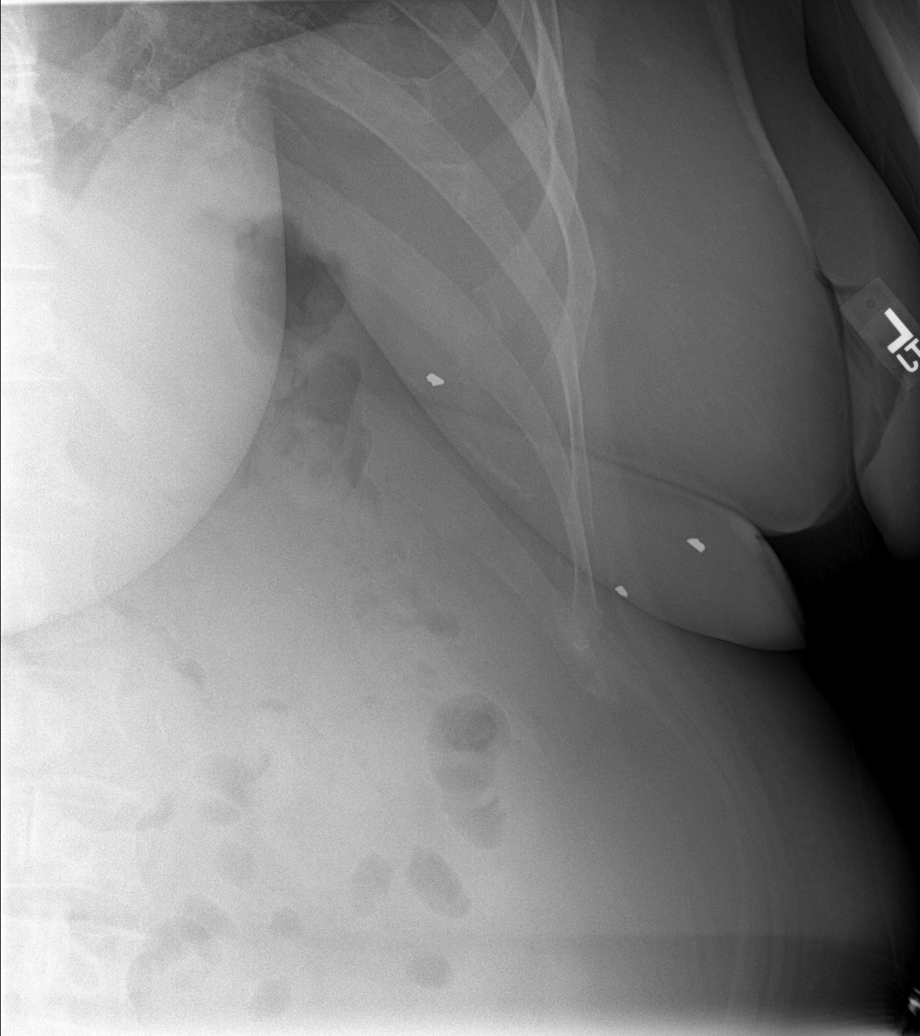

[rib ap (1 of 2)]
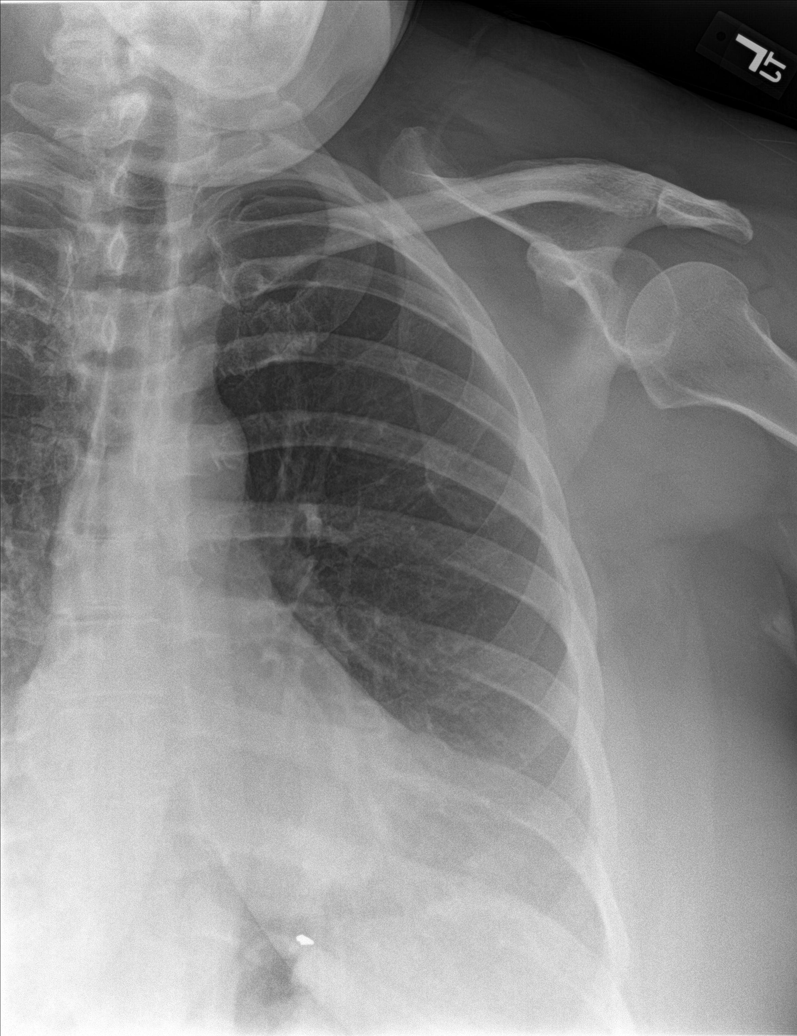

[rib ap (2 of 2)]
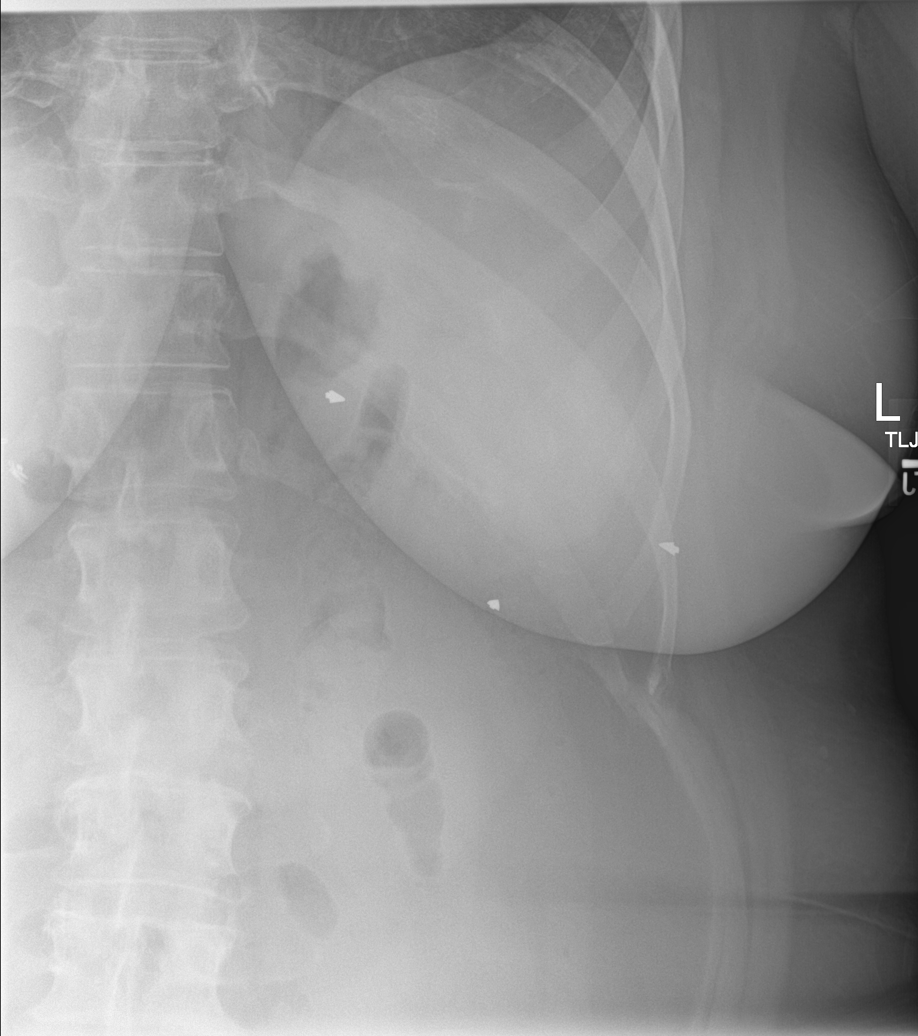

[5 of 5 positions shown; findings below may reference images not displayed]

FINDINGS: No fracture or other bone lesions are seen involving the ribs. There
is no evidence of pneumothorax or pleural effusion. Both lungs are
clear. Heart size and mediastinal contours are within normal limits.
IMPRESSION: Negative.

## 2021-04-24 ENCOUNTER — Other Ambulatory Visit: Payer: Self-pay | Admitting: Nurse Practitioner

## 2021-04-24 DIAGNOSIS — R059 Cough, unspecified: Secondary | ICD-10-CM

## 2021-06-03 ENCOUNTER — Ambulatory Visit: Payer: BC Managed Care – PPO | Admitting: Medical

## 2021-06-03 MED ORDER — BENZONATATE 100 MG PO CAPS: 100.0000 mg | ORAL_CAPSULE | Freq: Three times a day (TID) | ORAL | 0 refills | Status: DC | PRN

## 2021-06-03 NOTE — Patient Instructions (Addendum)

## 2021-06-03 NOTE — Progress Notes (Signed)
   Subjective:    Patient ID: Debra Gonzalez, female    DOB: February 21, 1966, 55 y.o.   MRN: [id number]  HPI 55 yo female in non acute distress, consents to telemedicine appointment. Saturday not feeling well. Covid-19 test negative. Rested Sunday alternated Tylenol and Motrin  101.7. Monday tested Covid-19 tested positive w/100.4. SOB yesterday resting , today better breathing. On zyrtec and flonase  Morbid Obesity and Asthma ( has inhaler and nebulizer treatments).  Allergies  Allergen Reactions   Sulfa Antibiotics Nausea And Vomiting   Cinnamon     "eating too much causes lip swelling"   Codeine Nausea Only   Spinach Swelling    If 'eats too much gets lip swelling'   Wheat Bran     "eating too much causes lip swelling"    Review of Systems  Constitutional:  Positive for chills, fatigue and fever.  HENT:  Positive for congestion and postnasal drip. Negative for rhinorrhea and sore throat (scratchy).   Respiratory:  Positive for cough and shortness of breath (sitting around yesterday , better today). Negative for wheezing.   Cardiovascular:  Negative for chest pain.  Gastrointestinal:  Positive for diarrhea (saturday only) and nausea (saturday only). Negative for abdominal pain and vomiting.  Genitourinary:  Negative for difficulty urinating.  Musculoskeletal:  Positive for myalgias.  Skin:  Negative for color change.  Allergic/Immunologic: Positive for environmental allergies.  Neurological:  Positive for headaches. Negative for dizziness, syncope and light-headedness.      Objective:   Physical Exam  AXOX3 Cough noted on call Speaking in full sentences.  No physical performed due to telemedicine appointment.      Assessment & Plan:  Covid-19 infection Morbid Obesity and Asthma Recommend anti monolonal antibodies treatment. Will talk to patient around noon tomorrow. Patient states she is using her inhaler. Meds ordered this encounter  Medications   benzonatate  (TESSALON PERLES) 100 MG capsule    Sig: Take 1 capsule (100 mg total) by mouth 3 (three) times daily as needed.    Dispense:  30 capsule    Refill:  0  Patient verbalizes understanding and has no questions at the end of our conversation.

## 2021-06-04 ENCOUNTER — Encounter: Payer: Self-pay | Admitting: Medical

## 2021-06-04 NOTE — Addendum Note (Signed)
Addended by: Verner Mccrone, Herbert Seta R on: 06/04/2021 10:40 AM   Modules accepted: Orders

## 2021-06-05 ENCOUNTER — Ambulatory Visit (INDEPENDENT_AMBULATORY_CARE_PROVIDER_SITE_OTHER): Payer: BC Managed Care – PPO

## 2021-06-05 ENCOUNTER — Ambulatory Visit: Payer: BC Managed Care – PPO | Admitting: Medical

## 2021-06-05 ENCOUNTER — Other Ambulatory Visit: Payer: Self-pay

## 2021-06-05 DIAGNOSIS — U071 COVID-19: Secondary | ICD-10-CM

## 2021-06-05 DIAGNOSIS — R051 Acute cough: Secondary | ICD-10-CM

## 2021-06-05 DIAGNOSIS — R062 Wheezing: Secondary | ICD-10-CM

## 2021-06-05 DIAGNOSIS — J4540 Moderate persistent asthma, uncomplicated: Secondary | ICD-10-CM

## 2021-06-05 MED ORDER — FAMOTIDINE IN NACL 20-0.9 MG/50ML-% IV SOLN: 20.0000 mg | Freq: Once | INTRAVENOUS | Status: AC | PRN

## 2021-06-05 MED ORDER — SODIUM CHLORIDE 0.9 % IV SOLN: INTRAVENOUS | Status: DC | PRN

## 2021-06-05 MED ORDER — METHYLPREDNISOLONE SODIUM SUCC 125 MG IJ SOLR: 125.0000 mg | Freq: Once | INTRAMUSCULAR | Status: AC | PRN

## 2021-06-05 MED ORDER — PREDNISONE 10 MG (21) PO TBPK: ORAL_TABLET | ORAL | 0 refills | Status: DC

## 2021-06-05 MED ORDER — EPINEPHRINE 0.3 MG/0.3ML IJ SOAJ: 0.3000 mg | Freq: Once | INTRAMUSCULAR | Status: AC | PRN

## 2021-06-05 MED ORDER — ALBUTEROL SULFATE HFA 108 (90 BASE) MCG/ACT IN AERS: 2.0000 | INHALATION_SPRAY | Freq: Once | RESPIRATORY_TRACT | Status: AC | PRN

## 2021-06-05 MED ORDER — DOXYCYCLINE HYCLATE 100 MG PO TABS: 100.0000 mg | ORAL_TABLET | Freq: Two times a day (BID) | ORAL | 0 refills | Status: DC

## 2021-06-05 MED ORDER — DIPHENHYDRAMINE HCL 50 MG/ML IJ SOLN: 50.0000 mg | Freq: Once | INTRAMUSCULAR | Status: AC | PRN

## 2021-06-05 MED ORDER — PREDNISONE 20 MG PO TABS: 20.0000 mg | ORAL_TABLET | Freq: Every day | ORAL | 0 refills | Status: DC

## 2021-06-05 MED ORDER — BEBTELOVIMAB 175 MG/2 ML IV (EUA)
175.0000 mg | Freq: Once | INTRAMUSCULAR | Status: AC
  Administered 2021-06-05: 175 mg via INTRAVENOUS

## 2021-06-05 NOTE — Patient Instructions (Signed)
10 Things You Can Do to Manage Your COVID-19 Symptoms at Home If you have possible or confirmed COVID-19 Stay home except to get medical care. Monitor your symptoms carefully. If your symptoms get worse, call your healthcare provider immediately. Get rest and stay hydrated. If you have a medical appointment, call the healthcare provider ahead of time and tell them that you have or may have COVID-19. For medical emergencies, call 911 and notify the dispatch personnel that you have or may have COVID-19. Cover your cough and sneezes with a tissue or use the inside of your elbow. Wash your hands often with soap and water for at least 20 seconds or clean your hands with an alcohol-based hand sanitizer that contains at least 60% alcohol. As much as possible, stay in a specific room and away from other people in your home. Also, you should use a separate bathroom, if available. If you need to be around other people in or outside of the home, wear a mask. Avoid sharing personal items with other people in your household, like dishes, towels, and bedding. Clean all surfaces that are touched often, like counters, tabletops, and doorknobs. Use household cleaning sprays or wipes according to the label instructions. cdc.gov/coronavirus. Patient was given drug information sheet for Bebtelovimab.  Also given cost estimate sheet for Bebtelovimab.  Patient reviewed documentation and questions were answered.  Patient would like to proceed with treatment at this time.    01/20/2020 This information is not intended to replace advice given to you by your health care provider. Make sure you discuss any questions you have with your health care provider. Document Revised: 03/15/2021 Document Reviewed: 03/15/2021 Elsevier Patient Education  2022 Elsevier Inc.  

## 2021-06-05 NOTE — Progress Notes (Signed)
   Subjective:    Patient ID: Debra Gonzalez, female    DOB: [birth year], 55 y.o.   MRN: [id number]  HPI 55 yo female in non acute distress. ST yesterday and wheezing at night . Only using Albuterol MDI not nebulizer treatments. No fever, SOB with  walking , mildly with activity. Cough now productive yellow and blood  patient states it feels like it is coming from her throat., denies chest pain but does have body aches.Denies Dizzniness or lightheadedness today. No syncope.  Covid vaccinated Pfizer and one booster  Using around noon Albuterol MDI and then around  3pm. With  2 times in sthe evening  and  one at 7pm and 11pm.. Tylenol 500mg  takes 2,  using it every8  hours alternating with Motrin 200mg   taking  2 tablets every 8 hours..  Allergies  Allergen Reactions   Sulfa Antibiotics Nausea And Vomiting   Cinnamon     "eating too much causes lip swelling"   Codeine Nausea Only   Spinach Swelling    If 'eats too much gets lip swelling'   Wheat Bran     "eating too much causes lip swelling"     Review of Systems  HENT:  Positive for sore throat.   Respiratory:  Positive for shortness of breath and wheezing.   Cardiovascular:  Negative for chest pain.  Musculoskeletal:  Positive for myalgias.  Neurological:  Negative for dizziness, syncope, light-headedness and headaches.      Objective:   Physical Exam  AXOX3 Able to speak in full sentences Cough noted on phone call No SOB heard Patient in non acute distress.      Assessment & Plan:  Covid-19 infection Hx of Asthma , morbid obesity Scheduled for monoclonal antibodies today at  3:15pm at [facility] [address], [place]. Placed patient on Prednisone( low dose) and Doxycycline. Meds ordered this encounter  Medications   DISCONTD: predniSONE (STERAPRED UNI-PAK 21 TAB) 10 MG (21) TBPK tablet    Sig: Take 6 tablets by mouth today then  5 tablets tomorrow then one tablet  less every day the after.     Dispense:  21 tablet    Refill:  0   doxycycline (VIBRA-TABS) 100 MG tablet    Sig: Take 1 tablet (100 mg total) by mouth 2 (two) times daily.    Dispense:  14 tablet    Refill:  0   predniSONE (DELTASONE) 20 MG tablet    Sig: Take 1 tablet (20 mg total) by mouth daily with breakfast.    Dispense:  7 tablet    Refill:  0    Discussed plan with Dr. [facility] he is in agreement. Return to clinic in 3-5 days if not imrproving. Patient verbalizes understanding and has no questions at discharge.

## 2021-06-05 NOTE — Progress Notes (Signed)
Diagnosis: COVID  Provider:  Chilton Greathouse, MD  Procedure: Infusion  IV Type: Peripheral, IV Location: L Antecubital  Bebtelovimab, Dose: 175 mg  Infusion Start Time: 1533  Infusion Stop Time: 1534  Post Infusion IV Care: Observation period completed and Peripheral IV Discontinued  Discharge: Condition: Good, Destination: Home . AVS provided to patient.   Performed by:  Nat Math, RN

## 2021-06-06 ENCOUNTER — Ambulatory Visit: Payer: BC Managed Care – PPO | Admitting: Nurse Practitioner

## 2021-06-06 DIAGNOSIS — U071 COVID-19: Secondary | ICD-10-CM

## 2021-06-06 NOTE — Progress Notes (Signed)
Debra Gonzalez, connected with  [name]  ([id number], 1965-10-26) on 06/06/21 at 10:00 AM EST by telephone application and verified that I am speaking with the correct person using two identifiers.  Location: Patient: Virtual Visit Location Patient: Home Provider: Virtual Visit Location Provider: Office/Clinic   I discussed the limitations of evaluation and management by telemedicine and the availability of in person appointments. The patient expressed understanding and agreed to proceed.    History of Present Illness: Debra Gonzalez is a 55 y.o. who identifies as a female who was assigned female at birth, and is being seen today for follow up after monoclonal infusion yesterday for acute COVID-19 infection. She has been without a fever for 24 hours, she has been using her nebulizer and started the prednisone today. She feels much improved today overall.   She has some SOB but feels much improved.    Problems:  Patient Active Problem List   Diagnosis Date Noted   Asthma 12/12/2019   HLD (hyperlipidemia) 12/12/2019   Elevated blood pressure reading without diagnosis of hypertension 12/12/2019    Allergies:  Allergies  Allergen Reactions   Sulfa Antibiotics Nausea And Vomiting   Cinnamon     "eating too much causes lip swelling"   Codeine Nausea Only   Spinach Swelling    If 'eats too much gets lip swelling'   Wheat Bran     "eating too much causes lip swelling"   Medications:  Current Outpatient Medications:    acetaminophen (TYLENOL) 500 MG tablet, Take 500 mg by mouth every 6 (six) hours as needed., Disp: , Rfl:    albuterol (PROVENTIL HFA;VENTOLIN HFA) 108 (90 Base) MCG/ACT inhaler, Inhale 2 puffs into the lungs every 6 (six) hours as needed for wheezing or shortness of breath., Disp: , Rfl:    albuterol (PROVENTIL) (2.5 MG/3ML) 0.083% nebulizer solution, Take 3 mLs (2.5 mg total) by nebulization every 4 (four) hours as needed for wheezing or shortness of breath., Disp:  75 mL, Rfl: 12   b complex vitamins tablet, Take 1 tablet by mouth daily., Disp: , Rfl:    benzonatate (TESSALON PERLES) 100 MG capsule, Take 1 capsule (100 mg total) by mouth 3 (three) times daily as needed., Disp: 30 capsule, Rfl: 0   Biotin 1000 MCG tablet, Take 1,000 mcg by mouth 3 (three) times daily., Disp: , Rfl:    cetirizine (ZYRTEC) 10 MG tablet, Take 10 mg by mouth daily., Disp: , Rfl:    Cholecalciferol (VITAMIN D-3) 5000 UNIT/ML LIQD, Place under the tongue., Disp: , Rfl:    doxycycline (VIBRA-TABS) 100 MG tablet, Take 1 tablet (100 mg total) by mouth 2 (two) times daily., Disp: 14 tablet, Rfl: 0   fluticasone (FLONASE) 50 MCG/ACT nasal spray, Place 2 sprays into both nostrils daily., Disp: , Rfl:    ibuprofen (ADVIL) 200 MG tablet, Take 200 mg by mouth every 6 (six) hours as needed., Disp: , Rfl:    Magnesium 100 MG TABS, Take by mouth., Disp: , Rfl:    Multiple Vitamin (MULTIVITAMIN) tablet, Take 1 tablet by mouth daily., Disp: , Rfl:    predniSONE (DELTASONE) 20 MG tablet, Take 1 tablet (20 mg total) by mouth daily with breakfast., Disp: 7 tablet, Rfl: 0  Current Facility-Administered Medications:    0.9 %  sodium chloride infusion, , Intravenous, PRN, Ratcliffe, Heather R, PA-C  Observations/Objective: No labored breathing. Speech is clear and coherent with logical content.    Assessment and Plan: 1. COVID-19 virus infection  Continue management with nebulizer treatments as discussed  Will follow up again tomorrow prior to weekend Patient will also call back with any new questions or concerns       Follow Up Instructions: I discussed the assessment and treatment plan with the patient. The patient was provided an opportunity to ask questions and all were answered. The patient agreed with the plan and demonstrated an understanding of the instructions.  A copy of instructions were sent to the patient via MyChart unless otherwise noted below.     The patient was advised  to call back or seek an in-person evaluation if the symptoms worsen or if the condition fails to improve as anticipated.  Time:  I spent 10 minutes with the patient via telehealth technology discussing the above problems/concerns.    Viviano Simas, FNP

## 2021-06-07 ENCOUNTER — Encounter: Payer: Self-pay | Admitting: Nurse Practitioner

## 2021-06-07 ENCOUNTER — Telehealth: Payer: Self-pay | Admitting: Nurse Practitioner

## 2021-06-07 NOTE — Telephone Encounter (Signed)
No answer

## 2021-06-10 ENCOUNTER — Telehealth: Payer: Self-pay

## 2021-06-10 NOTE — Telephone Encounter (Signed)
Called patient to follow up post Covid 19 and after receiving monoclonal antibodies. Patient reports she is feeling much better. She stated she is doing 1 breathing treatment /day but after the receiving the monoclonal antibodies that she is doing much better. Advised patient to call the office if she needs anything else.

## 2021-06-18 ENCOUNTER — Encounter: Payer: Self-pay | Admitting: Medical

## 2021-06-18 ENCOUNTER — Other Ambulatory Visit: Payer: Self-pay

## 2021-06-18 ENCOUNTER — Ambulatory Visit: Payer: BC Managed Care – PPO | Admitting: Medical

## 2021-06-18 VITALS — BP 126/82 | HR 98 | Temp 98.4°F | Resp 18

## 2021-06-18 DIAGNOSIS — R062 Wheezing: Secondary | ICD-10-CM

## 2021-06-18 DIAGNOSIS — R051 Acute cough: Secondary | ICD-10-CM

## 2021-06-18 DIAGNOSIS — J4520 Mild intermittent asthma, uncomplicated: Secondary | ICD-10-CM

## 2021-06-18 MED ORDER — ADVAIR HFA 230-21 MCG/ACT IN AERO: 2.0000 | INHALATION_SPRAY | Freq: Two times a day (BID) | RESPIRATORY_TRACT | 12 refills | Status: DC

## 2021-06-18 MED ORDER — ALBUTEROL SULFATE (2.5 MG/3ML) 0.083% IN NEBU: 2.5000 mg | INHALATION_SOLUTION | Freq: Four times a day (QID) | RESPIRATORY_TRACT | 1 refills | Status: DC | PRN

## 2021-06-18 NOTE — Progress Notes (Signed)
Subjective:    Patient ID: Debra Gonzalez, female    DOB: 1965-11-04, 55 y.o.   MRN: [id number]  HPI  55 yo female in non acute distress. Presents to day s/p Covid w/ monoclonal antibody treatment.  Treated with Doxy and prednisone 20 mg/day x  7 days. Treated  05/05/21.  Using better after Doxy and prednisone felt better   Back at work.  Blood pressure 126/82, pulse 98, temperature 98.4 F (36.9 C), temperature source Tympanic, resp. rate 18, SpO2 98 %.  Allergies  Allergen Reactions   Sulfa Antibiotics Nausea And Vomiting   Cinnamon     "eating too much causes lip swelling"   Codeine Nausea Only   Corn-Containing Products Other (See Comments)    Throat swelling   Spinach Swelling    If 'eats too much gets lip swelling'   Wheat Bran     "eating too much causes lip swelling"      Review of Systems  Constitutional:  Negative for chills and fever.  HENT:  Positive for congestion, ear pain (right ear pain 7/10 2 days.) and voice change. Negative for sore throat.   Respiratory:  Positive for cough, shortness of breath and wheezing.   Cardiovascular:  Negative for chest pain.  Gastrointestinal:  Positive for diarrhea (last ngiht and this am D/N) and nausea. Negative for abdominal pain.  Genitourinary:  Negative for difficulty urinating.  Musculoskeletal:  Negative for myalgias.  Skin:  Negative for color change.  Allergic/Immunologic: Positive for environmental allergies and food allergies (spinach and corn , wheat).      Objective:   Physical Exam Vitals and nursing note reviewed.  Constitutional:      Appearance: Normal appearance. She is obese.  HENT:     Head: Normocephalic and atraumatic.     Right Ear: Ear canal and external ear normal.     Left Ear: Ear canal and external ear normal.     Nose: Congestion present.     Mouth/Throat:     Mouth: Mucous membranes are moist.     Pharynx: Oropharynx is clear.  Eyes:     Extraocular Movements: Extraocular movements  intact.     Conjunctiva/sclera: Conjunctivae normal.     Pupils: Pupils are equal, round, and reactive to light.  Cardiovascular:     Rate and Rhythm: Normal rate and regular rhythm.     Heart sounds: No murmur heard.   No friction rub. No gallop.  Pulmonary:     Effort: Pulmonary effort is normal.     Breath sounds: Normal breath sounds.  Musculoskeletal:     Cervical back: Normal range of motion and neck supple.  Skin:    General: Skin is warm and dry.     Capillary Refill: Capillary refill takes less than 2 seconds.  Neurological:     General: No focal deficit present.     Mental Status: She is alert and oriented to person, place, and time. Mental status is at baseline.  Psychiatric:        Mood and Affect: Mood normal.        Thought Content: Thought content normal.        Judgment: Judgment normal.          Assessment & Plan:  Cough resollving Covid-19 resolved Wheezing. Asthma  Meds ordered this encounter  Medications   fluticasone-salmeterol (ADVAIR HFA) 230-21 MCG/ACT inhaler    Sig: Inhale 2 puffs into the lungs 2 (two) times daily.    Dispense:  1 each    Refill:  12   albuterol (PROVENTIL) (2.5 MG/3ML) 0.083% nebulizer solution    Sig: Take 3 mLs (2.5 mg total) by nebulization every 6 (six) hours as needed for wheezing or shortness of breath.    Dispense:  150 mL    Refill:  1   Patient verbalizes understanding and has no questions at discharge.

## 2021-07-23 ENCOUNTER — Other Ambulatory Visit: Payer: Self-pay | Admitting: Nurse Practitioner

## 2021-09-12 DIAGNOSIS — M5416 Radiculopathy, lumbar region: Secondary | ICD-10-CM | POA: Diagnosis not present

## 2021-09-12 DIAGNOSIS — M47896 Other spondylosis, lumbar region: Secondary | ICD-10-CM | POA: Diagnosis not present

## 2021-09-12 DIAGNOSIS — M47816 Spondylosis without myelopathy or radiculopathy, lumbar region: Secondary | ICD-10-CM | POA: Diagnosis not present

## 2021-09-23 ENCOUNTER — Encounter: Payer: Self-pay | Admitting: Nurse Practitioner

## 2021-09-23 ENCOUNTER — Ambulatory Visit: Payer: BC Managed Care – PPO | Admitting: Nurse Practitioner

## 2021-09-23 ENCOUNTER — Other Ambulatory Visit: Payer: Self-pay

## 2021-09-23 VITALS — BP 122/80 | HR 87 | Temp 97.8°F | Resp 18

## 2021-09-23 DIAGNOSIS — K529 Noninfective gastroenteritis and colitis, unspecified: Secondary | ICD-10-CM

## 2021-09-23 NOTE — Progress Notes (Signed)
? ?  Subjective:  ? ? Patient ID: Debra Gonzalez, female    DOB: 12-04-1965, 56 y.o.   MRN: [id number] ? ?HPI ?56 y/o Caucasian female presents to the clinic with a 5 day history of nausea, vomiting, and diarrhea. She states that her last episode of vomiting was early Friday morning and her last episode of diarrhea was Sunday evening. She does complain of generalized abdominal pain and sulfur burps. She has been able to keep bland foods and fluids down. She denies fever, recent travel or sick contacts. She states that she had a similar episode 2 weeks ago but it only last for about 12 hours. She states that she feels dizzy and weak. ? ? ?Review of Systems  ?Constitutional:  Positive for appetite change and fatigue. Negative for chills, diaphoresis and fever.  ?HENT: Negative.    ?Eyes: Negative.   ?Respiratory: Negative.    ?Cardiovascular: Negative.   ?Gastrointestinal:  Positive for abdominal distention, abdominal pain, diarrhea, nausea and vomiting.  ?Endocrine: Negative.   ?Genitourinary: Negative.   ?Musculoskeletal: Negative.   ?Skin: Negative.   ?Allergic/Immunologic: Negative.   ?Neurological:  Positive for dizziness, weakness and light-headedness.  ?Hematological: Negative.   ?Psychiatric/Behavioral: Negative.    ? ?   ?Objective:  ? Physical Exam ?Vitals reviewed.  ?Constitutional:   ?   General: She is not in acute distress. ?   Appearance: Normal appearance. She is obese.  ?HENT:  ?   Head: Normocephalic and atraumatic.  ?Eyes:  ?   Conjunctiva/sclera: Conjunctivae normal.  ?Cardiovascular:  ?   Rate and Rhythm: Normal rate and regular rhythm.  ?   Heart sounds: No murmur heard. ?  No friction rub. No gallop.  ?Pulmonary:  ?   Effort: Pulmonary effort is normal.  ?   Breath sounds: Normal breath sounds.  ?Abdominal:  ?   General: Bowel sounds are normal. There is no distension.  ?   Palpations: Abdomen is soft. There is no mass.  ?   Tenderness: There is abdominal tenderness. There is no guarding or rebound.   ?   Hernia: No hernia is present.  ?Musculoskeletal:     ?   General: Normal range of motion.  ?   Cervical back: Normal range of motion.  ?Skin: ?   General: Skin is warm and dry.  ?Neurological:  ?   General: No focal deficit present.  ?   Mental Status: She is alert and oriented to person, place, and time.  ?Psychiatric:     ?   Mood and Affect: Mood normal.     ?   Behavior: Behavior normal.     ?   Thought Content: Thought content normal.     ?   Judgment: Judgment normal.  ? ? ?Vitals:  ? 09/23/21 1423  ?BP: 122/80  ?Pulse: 87  ?Resp: 18  ?Temp: 97.8 ?F (36.6 ?C)  ?SpO2: 98%  ?  ? ? ?   ?Assessment & Plan:  ?1. Gastroenteritis ?Recommend following BRAT diet and slowly advancing diet as tolerated. Avoid dairy, acidic, fatty, and spicy foods. Differential diagnosis is a new food sensitivity. Recommend follow up with PCP or ENT for further allergy testing if symptoms persist. ?   ? ?

## 2021-09-24 DIAGNOSIS — M545 Low back pain, unspecified: Secondary | ICD-10-CM | POA: Diagnosis not present

## 2021-10-18 DIAGNOSIS — M5416 Radiculopathy, lumbar region: Secondary | ICD-10-CM | POA: Diagnosis not present

## 2021-10-21 ENCOUNTER — Encounter: Payer: Self-pay | Admitting: Emergency Medicine

## 2021-10-21 ENCOUNTER — Ambulatory Visit
Admission: EM | Admit: 2021-10-21 | Discharge: 2021-10-21 | Disposition: A | Discharge status: Home / Self Care | Payer: BC Managed Care – PPO | Attending: Emergency Medicine | Admitting: Emergency Medicine

## 2021-10-21 DIAGNOSIS — L03116 Cellulitis of left lower limb: Secondary | ICD-10-CM | POA: Diagnosis not present

## 2021-10-21 MED ORDER — CEPHALEXIN 500 MG PO CAPS: 500.0000 mg | ORAL_CAPSULE | Freq: Four times a day (QID) | ORAL | 0 refills | Status: DC

## 2021-10-21 NOTE — ED Triage Notes (Signed)
Pt presents with a rash on her left shin x 4 days  ?

## 2021-10-21 NOTE — ED Provider Notes (Signed)
?[facility] ? ? ? ?CSN: [id number] ?Arrival date & time: 10/21/21  [id number] ? ? ?  ? ?History   ?Chief Complaint ?Chief Complaint  ?Patient presents with  ? Rash  ? ? ?HPI ?Debra Gonzalez is a 56 y.o. female.  Patient presents with 4-day history of redness and rash on her left anterior lower leg from unknown cause.  The rash is spreading.  No falls or injury.  No insect bites.  She denies fever, drainage, or other symptoms.  No treatments at home.  Her medical history includes asthma and hyperlipidemia.  ? ?The history is provided by the patient and medical records.  ? ?Past Medical History:  ?Diagnosis Date  ? Asthma   ? Hyperlipidemia   ? Reactive airway disease   ? ? ?Patient Active Problem List  ? Diagnosis Date Noted  ? Asthma 12/12/2019  ? HLD (hyperlipidemia) 12/12/2019  ? Elevated blood pressure reading without diagnosis of hypertension 12/12/2019  ? ? ?Past Surgical History:  ?Procedure Laterality Date  ? CHOLECYSTECTOMY    ? TONSILLECTOMY    ? ? ?OB History   ?No obstetric history on file. ?  ? ? ? ?Home Medications   ? ?Prior to Admission medications   ?Medication Sig Start Date End Date Taking? Authorizing Provider  ?cephALEXin (KEFLEX) 500 MG capsule Take 1 capsule (500 mg total) by mouth 4 (four) times daily. 10/21/21  Yes Sharion Balloon, NP  ?acetaminophen (TYLENOL) 500 MG tablet Take 500 mg by mouth every 6 (six) hours as needed.    [provider]  ?albuterol (PROVENTIL HFA;VENTOLIN HFA) 108 (90 Base) MCG/ACT inhaler Inhale 2 puffs into the lungs every 6 (six) hours as needed for wheezing or shortness of breath.    [provider]  ?albuterol (PROVENTIL) (2.5 MG/3ML) 0.083% nebulizer solution Take 3 mLs (2.5 mg total) by nebulization every 6 (six) hours as needed for wheezing or shortness of breath. 06/18/21   Ratcliffe, Nira Conn R, PA-C  ?b complex vitamins tablet Take 1 tablet by mouth daily.    [provider]  ?benzonatate (TESSALON PERLES) 100 MG capsule Take 1  capsule (100 mg total) by mouth 3 (three) times daily as needed. 06/03/21   Talmage Nap, PA-C  ?Biotin 1000 MCG tablet Take 1,000 mcg by mouth 3 (three) times daily.    [provider]  ?cetirizine (ZYRTEC) 10 MG tablet Take 10 mg by mouth daily.    [provider]  ?Cholecalciferol (VITAMIN D-3) 5000 UNIT/ML LIQD Place under the tongue.    [provider]  ?doxycycline (VIBRA-TABS) 100 MG tablet Take 1 tablet (100 mg total) by mouth 2 (two) times daily. 06/05/21   Ratcliffe, Heather R, PA-C  ?fluticasone (FLONASE) 50 MCG/ACT nasal spray Place 2 sprays into both nostrils daily.    [provider]  ?fluticasone-salmeterol (ADVAIR HFA) 230-21 MCG/ACT inhaler Inhale 2 puffs into the lungs 2 (two) times daily. 06/18/21   Ratcliffe, Nira Conn R, PA-C  ?ibuprofen (ADVIL) 200 MG tablet Take 200 mg by mouth every 6 (six) hours as needed.    [provider]  ?Magnesium 100 MG TABS Take by mouth.    [provider]  ?Multiple Vitamin (MULTIVITAMIN) tablet Take 1 tablet by mouth daily.    [provider]  ?predniSONE (DELTASONE) 20 MG tablet Take 1 tablet (20 mg total) by mouth daily with breakfast. 06/05/21   Ratcliffe, Estill Dooms, PA-C  ? ? ?Family History ?Family History  ?Problem Relation Age of Onset  ?  Mitral valve prolapse Mother   ? Depression Mother   ? Hyperlipidemia Mother   ? Hypertension Mother   ? Melanoma Father   ? Hyperlipidemia Maternal Grandmother   ? Hypertension Maternal Grandmother   ? Diabetes Maternal Grandfather   ? Hyperlipidemia Paternal Grandmother   ? Stroke Paternal Grandmother   ? Bone cancer Paternal Grandfather   ? ? ?Social History ?Social History  ? ?Tobacco Use  ? Smoking status: Never  ? Smokeless tobacco: Never  ?Vaping Use  ? Vaping Use: Never used  ?Substance Use Topics  ? Alcohol use: Yes  ?  Alcohol/week: 1.0 standard drink  ?  Types: 1 Glasses of wine per week  ?  Comment: weekly  ? Drug use: No  ? ? ? ?Allergies    ?Sulfa antibiotics, Cinnamon, Codeine, Corn-containing products, Spinach, and Wheat bran ? ? ?Review of Systems ?Review of Systems  ?Constitutional:  Negative for chills and fever.  ?Musculoskeletal:  Negative for arthralgias and gait problem.  ?Skin:  Positive for color change and rash.  ?All other systems reviewed and are negative. ? ? ?Physical Exam ?Triage Vital Signs ?ED Triage Vitals  ?Enc Vitals Group  ?   BP 10/21/21 0834 140/87  ?   Pulse Rate 10/21/21 0834 (!) 105  ?   Resp 10/21/21 0834 18  ?   Temp 10/21/21 0834 98.4 ?F (36.9 ?C)  ?   Temp src --   ?   SpO2 10/21/21 0834 96 %  ?   Weight --   ?   Height --   ?   Head Circumference --   ?   Peak Flow --   ?   Pain Score 10/21/21 0836 0  ?   Pain Loc --   ?   Pain Edu? --   ?   Excl. in [place]? --   ? ?No data found. ? ?Updated Vital Signs ?BP 140/87   Pulse (!) 105   Temp 98.4 ?F (36.9 ?C)   Resp 18   LMP  (LMP Unknown)   SpO2 96%  ? ?Visual Acuity ?Right Eye Distance:   ?Left Eye Distance:   ?Bilateral Distance:   ? ?Right Eye Near:   ?Left Eye Near:    ?Bilateral Near:    ? ?Physical Exam ?Vitals and nursing note reviewed.  ?Constitutional:   ?   General: She is not in acute distress. ?   Appearance: She is well-developed. She is obese. She is not ill-appearing.  ?HENT:  ?   Mouth/Throat:  ?   Mouth: Mucous membranes are moist.  ?Cardiovascular:  ?   Rate and Rhythm: Normal rate and regular rhythm.  ?   Heart sounds: Normal heart sounds.  ?Pulmonary:  ?   Effort: Pulmonary effort is normal. No respiratory distress.  ?   Breath sounds: Normal breath sounds.  ?Musculoskeletal:     ?   General: Normal range of motion.  ?   Cervical back: Neck supple.  ?   Comments: Bilateral LE edema which patient reports is at baseline.    ?Skin: ?   General: Skin is warm and dry.  ?   Capillary Refill: Capillary refill takes less than 2 seconds.  ?   Findings: Erythema and rash present.  ?   Comments: Left anterior lower leg: Area of erythema. No open wound or  drainage. See pictures.   ?Neurological:  ?   General: No focal deficit present.  ?   Mental Status: She  is alert and oriented to person, place, and time.  ?   Sensory: No sensory deficit.  ?   Motor: No weakness.  ?   Gait: Gait normal.  ?Psychiatric:     ?   Mood and Affect: Mood normal.     ?   Behavior: Behavior normal.  ? ? ? ? ? ? ?UC Treatments / Results  ?Labs ?(all labs ordered are listed, but only abnormal results are displayed) ?Labs Reviewed - No data to display ? ?EKG ? ? ?Radiology ?No results found. ? ?Procedures ?Procedures (including critical care time) ? ?Medications Ordered in UC ?Medications - No data to display ? ?Initial Impression / Assessment and Plan / UC Course  ?I have reviewed the triage vital signs and the nursing notes. ? ?Pertinent labs & imaging results that were available during my care of the patient were reviewed by me and considered in my medical decision making (see chart for details). ? ?Cellulitis of left anterior lower leg.  Treating with cephalexin.  Instructed patient to follow-up with her PCP in 2 days for recheck.  ED precautions discussed for worsening symptoms.  Education provided on cellulitis.  Patient agrees to plan of care. ? ? ?Final Clinical Impressions(s) / UC Diagnoses  ? ?Final diagnoses:  ?Cellulitis of left anterior lower leg  ? ? ? ?Discharge Instructions   ? ?  ?Take the antibiotic as directed.  Follow up with your primary care provider for a recheck in 2 days.   ? ? ? ? ? ?ED Prescriptions   ? ? Medication Sig Dispense Auth. Provider  ? cephALEXin (KEFLEX) 500 MG capsule Take 1 capsule (500 mg total) by mouth 4 (four) times daily. 28 capsule Sharion Balloon, NP  ? ?  ? ?PDMP not reviewed this encounter. ?  ?Sharion Balloon, NP ?10/21/21 0901 ? ?

## 2021-10-21 NOTE — Discharge Instructions (Addendum)
Take the antibiotic as directed.  Follow up with your primary care provider for a recheck in 2 days.   ? ?

## 2022-10-23 ENCOUNTER — Other Ambulatory Visit: Payer: Self-pay

## 2022-10-23 ENCOUNTER — Ambulatory Visit
Admission: RE | Admit: 2022-10-23 | Discharge: 2022-10-23 | Disposition: A | Discharge status: Home / Self Care | Payer: BC Managed Care – PPO | Source: Ambulatory Visit | Attending: Urgent Care | Admitting: Urgent Care

## 2022-10-23 VITALS — BP 151/77 | HR 72 | Temp 99.2°F | Resp 18

## 2022-10-23 DIAGNOSIS — R519 Headache, unspecified: Secondary | ICD-10-CM

## 2022-10-23 MED ORDER — KETOROLAC TROMETHAMINE 30 MG/ML IJ SOLN
30.0000 mg | Freq: Once | INTRAMUSCULAR | Status: AC
  Administered 2022-10-23: 30 mg via INTRAMUSCULAR

## 2022-10-23 MED ORDER — PROMETHAZINE HCL 25 MG PO TABS: 25.0000 mg | ORAL_TABLET | Freq: Four times a day (QID) | ORAL | 0 refills | Status: DC | PRN

## 2022-10-23 NOTE — Discharge Instructions (Addendum)
Follow up here or with your primary care provider if your symptoms are worsening or not improving.     

## 2022-10-23 NOTE — ED Provider Notes (Signed)
[facility]    CSN: [id number] Arrival date & time: 10/23/22  1239      History   Chief Complaint Chief Complaint  Patient presents with   Migraine    Would like to have my A1C tested as I think my headaches may be related to my blood sugar levels. - Entered by patient   Appointment    13:00    HPI Debra Gonzalez is a 57 y.o. female.    Migraine    Patient presents to urgent care with complaint of headache x 2 days.  She reports intermittent headaches x 5 months.  Headaches are associated with nausea or vomiting.  She reports no vomiting currently.  Denies weakness, vision changes.  Pain initiates in her forehead and radiates to both sides of her head.  Feels like her head is going to "explode".  Patient states she no longer has a PCP.  Her provider left the practice.  Using ibuprofen and aspirin.  Reports "chest tightness" overnight which she attributes to anxiety.  Last PCP visit was 2 years ago.  She states that her headache is similar to previous headaches, though stronger.  Denies it being the "worst headache of her life".  Past Medical History:  Diagnosis Date   Asthma    Hyperlipidemia    Reactive airway disease     Patient Active Problem List   Diagnosis Date Noted   Asthma 12/12/2019   HLD (hyperlipidemia) 12/12/2019   Elevated blood pressure reading without diagnosis of hypertension 12/12/2019    Past Surgical History:  Procedure Laterality Date   CHOLECYSTECTOMY     TONSILLECTOMY      OB History   No obstetric history on file.      Home Medications    Prior to Admission medications   Medication Sig Start Date End Date Taking? Authorizing Provider  acetaminophen (TYLENOL) 500 MG tablet Take 500 mg by mouth every 6 (six) hours as needed.    [provider]  albuterol (PROVENTIL HFA;VENTOLIN HFA) 108 (90 Base) MCG/ACT inhaler Inhale 2 puffs into the lungs every 6 (six) hours as needed for wheezing or shortness of breath.     [provider]  albuterol (PROVENTIL) (2.5 MG/3ML) 0.083% nebulizer solution Take 3 mLs (2.5 mg total) by nebulization every 6 (six) hours as needed for wheezing or shortness of breath. 06/18/21   Ratcliffe, Herbert Seta R, PA-C  b complex vitamins tablet Take 1 tablet by mouth daily.    [provider]  benzonatate (TESSALON PERLES) 100 MG capsule Take 1 capsule (100 mg total) by mouth 3 (three) times daily as needed. 06/03/21   Ratcliffe, Wynn Banker, PA-C  Biotin 1000 MCG tablet Take 1,000 mcg by mouth 3 (three) times daily.    [provider]  cephALEXin (KEFLEX) 500 MG capsule Take 1 capsule (500 mg total) by mouth 4 (four) times daily. 10/21/21   Mickie Bail, NP  cetirizine (ZYRTEC) 10 MG tablet Take 10 mg by mouth daily.    [provider]  Cholecalciferol (VITAMIN D-3) 5000 UNIT/ML LIQD Place under the tongue.    [provider]  doxycycline (VIBRA-TABS) 100 MG tablet Take 1 tablet (100 mg total) by mouth 2 (two) times daily. 06/05/21   Ratcliffe, Heather R, PA-C  fluticasone (FLONASE) 50 MCG/ACT nasal spray Place 2 sprays into both nostrils daily.    [provider]  fluticasone-salmeterol (ADVAIR HFA) 230-21 MCG/ACT inhaler Inhale 2 puffs into the lungs 2 (two) times daily. 06/18/21  Ratcliffe, Heather R, PA-C  ibuprofen (ADVIL) 200 MG tablet Take 200 mg by mouth every 6 (six) hours as needed.    [provider]  Magnesium 100 MG TABS Take by mouth.    [provider]  Multiple Vitamin (MULTIVITAMIN) tablet Take 1 tablet by mouth daily.    [provider]  predniSONE (DELTASONE) 20 MG tablet Take 1 tablet (20 mg total) by mouth daily with breakfast. 06/05/21   Ratcliffe, Wynn Banker, PA-C    Family History Family History  Problem Relation Age of Onset   Mitral valve prolapse Mother    Depression Mother    Hyperlipidemia Mother    Hypertension Mother    Melanoma Father    Hyperlipidemia Maternal Grandmother     Hypertension Maternal Grandmother    Diabetes Maternal Grandfather    Hyperlipidemia Paternal Grandmother    Stroke Paternal Grandmother    Bone cancer Paternal Grandfather     Social History Social History   Tobacco Use   Smoking status: Never   Smokeless tobacco: Never  Vaping Use   Vaping Use: Never used  Substance Use Topics   Alcohol use: Yes    Alcohol/week: 1.0 standard drink of alcohol    Types: 1 Glasses of wine per week    Comment: weekly   Drug use: No     Allergies   Sulfa antibiotics, Cinnamon, Codeine, Corn-containing products, Spinach, and Wheat   Review of Systems Review of Systems   Physical Exam Triage Vital Signs ED Triage Vitals  Enc Vitals Group     BP 10/23/22 1301 (!) 151/77     Pulse Rate 10/23/22 1301 72     Resp 10/23/22 1301 18     Temp 10/23/22 1301 99.2 F (37.3 C)     Temp Source 10/23/22 1301 Oral     SpO2 10/23/22 1301 94 %     Weight --      Height --      Head Circumference --      Peak Flow --      Pain Score 10/23/22 1306 8     Pain Loc --      Pain Edu? --      Excl. in [place]? --    No data found.  Updated Vital Signs BP (!) 151/77 (BP Location: Left Arm)   Pulse 72   Temp 99.2 F (37.3 C) (Oral)   Resp 18   LMP  (LMP Unknown)   SpO2 94%   Visual Acuity Right Eye Distance:   Left Eye Distance:   Bilateral Distance:    Right Eye Near:   Left Eye Near:    Bilateral Near:     Physical Exam Vitals reviewed.  Constitutional:      Appearance: Normal appearance.  Cardiovascular:     Rate and Rhythm: Normal rate and regular rhythm.     Pulses: Normal pulses.     Heart sounds: Normal heart sounds.  Pulmonary:     Effort: Pulmonary effort is normal.     Breath sounds: Normal breath sounds.  Skin:    General: Skin is warm and dry.  Neurological:     General: No focal deficit present.     Mental Status: She is alert and oriented to person, place, and time.  Psychiatric:        Mood and Affect: Mood  normal.        Behavior: Behavior normal.      UC Treatments / Results  Labs (all labs ordered are listed, but only abnormal results are displayed) Labs Reviewed - No data to display  EKG   Radiology No results found.  Procedures Procedures (including critical care time)  Medications Ordered in UC Medications - No data to display  Initial Impression / Assessment and Plan / UC Course  I have reviewed the triage vital signs and the nursing notes.  Pertinent labs & imaging results that were available during my care of the patient were reviewed by me and considered in my medical decision making (see chart for details).   Debra Gonzalez is a 57 y.o. female presenting with acute headache. Patient is afebrile without recent antipyretics, satting well on room air. Overall is well appearing though non-toxic, well hydrated, without respiratory distress. Pulmonary exam is unremarkable.  Lungs CTAB without wheezing, rhonchi, rales.  Treating acute headache with Toradol 60 mg IM.  Discharging with Phenergan.  Strongly encourage patient to reestablish with a primary care provider.  Counseled patient on potential for adverse effects with medications prescribed/recommended today, ER and return-to-clinic precautions discussed, patient verbalized understanding and agreement with care plan.    Final Clinical Impressions(s) / UC Diagnoses   Final diagnoses:  None   Discharge Instructions   None    ED Prescriptions   None    PDMP not reviewed this encounter.   Charma Igo, [place] 10/23/22 1331

## 2022-10-23 NOTE — ED Triage Notes (Signed)
Headache for 2 days.  Reports headaches intermittently for 5 months.  Headaches are associated with nausea and/or vomiting.  No vomiting. This headache has lasted longer.  Denies weakness or vision changes. pain started in forehead and now radiating around both sides of head.  Felt like forehead was " going to explode"  Her doctor left her usual practice and has not decided on a new pcp.    Patient took ibuprofen, and aspirin yesterday.  Patient had chest tightness around 3:30 am-headache woke patient and then felt chest pain.  Patient reports this pain is associated with her anxiety.  And was evaluated in the past and thought to be not a problem. Patient has multiple concerns.    Last visit with a primary physician has been 2 years ago.    Patient is getting low on advair and albuterol inhalers

## 2022-11-03 ENCOUNTER — Encounter: Payer: Self-pay | Admitting: Internal Medicine

## 2022-11-03 ENCOUNTER — Ambulatory Visit: Payer: BC Managed Care – PPO | Admitting: Internal Medicine

## 2022-11-03 VITALS — BP 136/86 | HR 92 | Temp 96.8°F | Ht 70.0 in | Wt 335.0 lb

## 2022-11-03 DIAGNOSIS — Z136 Encounter for screening for cardiovascular disorders: Secondary | ICD-10-CM | POA: Diagnosis not present

## 2022-11-03 DIAGNOSIS — Z833 Family history of diabetes mellitus: Secondary | ICD-10-CM

## 2022-11-03 DIAGNOSIS — Z114 Encounter for screening for human immunodeficiency virus [HIV]: Secondary | ICD-10-CM

## 2022-11-03 DIAGNOSIS — Z1211 Encounter for screening for malignant neoplasm of colon: Secondary | ICD-10-CM | POA: Diagnosis not present

## 2022-11-03 DIAGNOSIS — R519 Headache, unspecified: Secondary | ICD-10-CM | POA: Insufficient documentation

## 2022-11-03 DIAGNOSIS — Z1159 Encounter for screening for other viral diseases: Secondary | ICD-10-CM

## 2022-11-03 DIAGNOSIS — Z6841 Body Mass Index (BMI) 40.0 and over, adult: Secondary | ICD-10-CM

## 2022-11-03 DIAGNOSIS — J4540 Moderate persistent asthma, uncomplicated: Secondary | ICD-10-CM

## 2022-11-03 MED ORDER — ALBUTEROL SULFATE HFA 108 (90 BASE) MCG/ACT IN AERS: 2.0000 | INHALATION_SPRAY | Freq: Four times a day (QID) | RESPIRATORY_TRACT | 0 refills | Status: AC | PRN

## 2022-11-03 NOTE — Assessment & Plan Note (Signed)
Continue Advair and albuterol as previously prescribed

## 2022-11-03 NOTE — Assessment & Plan Note (Signed)
Encourage diet and exercise for weight loss 

## 2022-11-03 NOTE — Progress Notes (Signed)
Subjective:    Patient ID: Debra Gonzalez, female    DOB: [birth year], 57 y.o.   MRN: [id number]  HPI  Patient presents to clinic today for follow-up of chronic conditions.  Frequent Headaches: These occur a few weeks.  She is not sure what triggers this, maybe stress. The headaches are located in her forehead and spread over her whole head. She does have some intermittent nausea, and vomiting but denies dizziness, vision changes, sensitivity to light and sound. She reports the headaches wake her up in the middle of the night and often times she wakes up with them. She has tried Ibuprofen with some relief of symptoms. She had a recent UC visit for the same.  She does not follow with neurology.  Asthma: She denies chronic cough or shortness of breath.  She uses Advair and  Albuterol only as needed.  There are no PFTs on file.  She follows with pulmonology.   Review of Systems  Past Medical History:  Diagnosis Date   Asthma    Hyperlipidemia    Reactive airway disease     Current Outpatient Medications  Medication Sig Dispense Refill   acetaminophen (TYLENOL) 500 MG tablet Take 500 mg by mouth every 6 (six) hours as needed.     albuterol (PROVENTIL HFA;VENTOLIN HFA) 108 (90 Base) MCG/ACT inhaler Inhale 2 puffs into the lungs every 6 (six) hours as needed for wheezing or shortness of breath.     albuterol (PROVENTIL) (2.5 MG/3ML) 0.083% nebulizer solution Take 3 mLs (2.5 mg total) by nebulization every 6 (six) hours as needed for wheezing or shortness of breath. 150 mL 1   b complex vitamins tablet Take 1 tablet by mouth daily.     Biotin 1000 MCG tablet Take 1,000 mcg by mouth 3 (three) times daily.     cetirizine (ZYRTEC) 10 MG tablet Take 10 mg by mouth daily.     Cholecalciferol (VITAMIN D-3) 5000 UNIT/ML LIQD Place under the tongue.     fluticasone (FLONASE) 50 MCG/ACT nasal spray Place 2 sprays into both nostrils daily.     fluticasone-salmeterol (ADVAIR HFA) 230-21 MCG/ACT inhaler  Inhale 2 puffs into the lungs 2 (two) times daily. 1 each 12   ibuprofen (ADVIL) 200 MG tablet Take 200 mg by mouth every 6 (six) hours as needed.     Magnesium 100 MG TABS Take by mouth.     Multiple Vitamin (MULTIVITAMIN) tablet Take 1 tablet by mouth daily.     promethazine (PHENERGAN) 25 MG tablet Take 1 tablet (25 mg total) by mouth every 6 (six) hours as needed for nausea or vomiting. 10 tablet 0   Current Facility-Administered Medications  Medication Dose Route Frequency Provider Last Rate Last Admin   0.9 %  sodium chloride infusion   Intravenous PRN Ratcliffe, Heather R, PA-C        Allergies  Allergen Reactions   Sulfa Antibiotics Nausea And Vomiting   Cinnamon     "eating too much causes lip swelling"   Codeine Nausea Only   Corn-Containing Products Other (See Comments)    Throat swelling   Spinach Swelling    If 'eats too much gets lip swelling'   Wheat     "eating too much causes lip swelling"    Family History  Problem Relation Age of Onset   Mitral valve prolapse Mother    Depression Mother    Hyperlipidemia Mother    Hypertension Mother    Melanoma Father    Hyperlipidemia  Maternal Grandmother    Hypertension Maternal Grandmother    Diabetes Maternal Grandfather    Hyperlipidemia Paternal Grandmother    Stroke Paternal Grandmother    Bone cancer Paternal Grandfather     Social History   Socioeconomic History   Marital status: Divorced    Spouse name: Not on file   Number of children: Not on file   Years of education: Not on file   Highest education level: Not on file  Occupational History   Not on file  Tobacco Use   Smoking status: Never   Smokeless tobacco: Never  Vaping Use   Vaping Use: Never used  Substance and Sexual Activity   Alcohol use: Yes    Alcohol/week: 1.0 standard drink of alcohol    Types: 1 Glasses of wine per week    Comment: weekly   Drug use: No   Sexual activity: Not on file  Other Topics Concern   Not on file   Social History Narrative   Not on file   Social Determinants of Health   Financial Resource Strain: Not on file  Food Insecurity: Not on file  Transportation Needs: Not on file  Physical Activity: Not on file  Stress: Not on file  Social Connections: Not on file  Intimate Partner Violence: Not on file     Constitutional: Patient reports frequent headaches.  Denies fever, malaise, fatigue, or abrupt weight changes.  HEENT: Denies eye pain, eye redness, ear pain, ringing in the ears, wax buildup, runny nose, nasal congestion, bloody nose, or sore throat. Respiratory: Denies difficulty breathing, shortness of breath, cough or sputum production.   Cardiovascular: Denies chest pain, chest tightness, palpitations or swelling in the hands or feet.  Gastrointestinal: Denies abdominal pain, bloating, constipation, diarrhea or blood in the stool.  GU: Denies urgency, frequency, pain with urination, burning sensation, blood in urine, odor or discharge. Musculoskeletal: Denies decrease in range of motion, difficulty with gait, muscle pain or joint pain and swelling.  Skin: Denies redness, rashes, lesions or ulcercations.  Neurological: Denies dizziness, difficulty with memory, difficulty with speech or problems with balance and coordination.  Psych: Denies anxiety, depression, SI/HI.  No other specific complaints in a complete review of systems (except as listed in HPI above).     Objective:   Physical Exam   BP 136/86 (BP Location: Left Arm, Patient Position: Sitting, Cuff Size: Large)   Pulse 92   Temp (!) 96.8 F (36 C) (Temporal)   Ht 5\' 10"  (1.778 m)   Wt (!) 335 lb (152 kg)   LMP  (LMP Unknown)   SpO2 98%   BMI 48.07 kg/m   Wt Readings from Last 3 Encounters:  01/08/21 (!) 324 lb 6.4 oz (147.1 kg)  02/20/20 (!) 317 lb 8 oz (144 kg)  12/30/19 (!) 325 lb 6.4 oz (147.6 kg)    General: Appears her stated age, obese, in NAD. Skin: Warm, dry and intact.  HEENT: Head: normal  shape and size; Eyes: sclera white, no icterus, conjunctiva pink, PERRLA and EOMs intact;  Cardiovascular: Normal rate and rhythm. S1,S2 noted.  No murmur, rubs or gallops noted. No JVD or BLE edema. No carotid bruits noted. Pulmonary/Chest: Normal effort and positive vesicular breath sounds. No respiratory distress. No wheezes, rales or ronchi noted.  Musculoskeletal: No difficulty with gait.  Neurological: Alert and oriented. Coordination normal.  Psychiatric: Mood and affect normal. Behavior is normal. Judgment and thought content normal.     BMET    Component  Value Date/Time   NA 138 11/24/2019 2056   K 4.1 11/24/2019 2056   CL 104 11/24/2019 2056   CO2 22 11/24/2019 2056   GLUCOSE 116 (H) 11/24/2019 2056   BUN 14 11/24/2019 2056   CREATININE 0.75 11/24/2019 2056   CALCIUM 8.8 (L) 11/24/2019 2056   GFRNONAA >60 11/24/2019 2056   GFRAA >60 11/24/2019 2056    Lipid Panel  No results found for: "CHOL", "TRIG", "HDL", "CHOLHDL", "VLDL", "LDLCALC"  CBC    Component Value Date/Time   WBC 7.7 11/24/2019 2056   RBC 4.34 11/24/2019 2056   HGB 14.2 11/24/2019 2056   HCT 40.7 11/24/2019 2056   PLT 289 11/24/2019 2056   MCV 93.8 11/24/2019 2056   MCH 32.7 11/24/2019 2056   MCHC 34.9 11/24/2019 2056   RDW 12.4 11/24/2019 2056    Hgb A1C No results found for: "HGBA1C"         Assessment & Plan:      RTC in 6 months for annual exam Nicki Reaper, NP

## 2022-11-03 NOTE — Assessment & Plan Note (Signed)
Likely stress-induced We will check CBC, c-Met, lipid, A1c and TSH today If labs normal, consider amitriptyline 25 mg nightly

## 2022-11-03 NOTE — Patient Instructions (Signed)
Form - Headache Record There are many types and causes of headaches. A headache record can help guide your treatment plan. Use this form to record the details. Bring this form with you to your follow-up visits. Follow your health care provider's instructions on how to describe your headache. You may be asked to: Use a pain scale. This is a tool to rate the intensity of your headache using words or numbers. Describe what your headache feels like, such as dull, achy, throbbing, or sharp. Headache record Date: _______________ Time (from start to end): ____________________ Location of the headache: _________________________ Intensity of the headache: ____________________ Description of the headache: ______________________________________________________________ Hours of sleep the night before the headache: __________ Food or drinks before the headache started: ______________________________________________________________________________________ Events before the headache started: _______________________________________________________________________________________________ Symptoms before the headache started: __________________________________________________________________________________________ Symptoms during the headache: __________________________________________________________________________________________________ Treatment: ________________________________________________________________________________________________________________ Effect of treatment: _________________________________________________________________________________________________________ Other comments: ___________________________________________________________________________________________________________ Date: _______________ Time (from start to end): ____________________ Location of the headache: _________________________ Intensity of the headache: ____________________ Description of the headache:  ______________________________________________________________ Hours of sleep the night before the headache: __________ Food or drinks before the headache started: ______________________________________________________________________________________ Events before the headache started: ____________________________________________________________________________________________ Symptoms before the headache started: _________________________________________________________________________________________ Symptoms during the headache: _______________________________________________________________________________________________ Treatment: ________________________________________________________________________________________________________________ Effect of treatment: _________________________________________________________________________________________________________ Other comments: ___________________________________________________________________________________________________________ Date: _______________ Time (from start to end): ____________________ Location of the headache: _________________________ Intensity of the headache: ____________________ Description of the headache: ______________________________________________________________ Hours of sleep the night before the headache: __________ Food or drinks before the headache started: ______________________________________________________________________________________ Events before the headache started: ____________________________________________________________________________________________ Symptoms before the headache started: _________________________________________________________________________________________ Symptoms during the headache: _______________________________________________________________________________________________ Treatment:  ________________________________________________________________________________________________________________ Effect of treatment: _________________________________________________________________________________________________________ Other comments: ___________________________________________________________________________________________________________ Date: _______________ Time (from start to end): ____________________ Location of the headache: _________________________ Intensity of the headache: ____________________ Description of the headache: ______________________________________________________________ Hours of sleep the night before the headache: _________ Food or drinks before the headache started: ______________________________________________________________________________________ Events before the headache started: ____________________________________________________________________________________________ Symptoms before the headache started: _________________________________________________________________________________________ Symptoms during the headache: _______________________________________________________________________________________________ Treatment: ________________________________________________________________________________________________________________ Effect of treatment: _________________________________________________________________________________________________________ Other comments: ___________________________________________________________________________________________________________ Date: _______________ Time (from start to end): ____________________ Location of the headache: _________________________ Intensity of the headache: ____________________ Description of the headache: ______________________________________________________________ Hours of sleep the night before the headache: _________ Food or drinks before the headache started:  ______________________________________________________________________________________ Events before the headache started: ____________________________________________________________________________________________ Symptoms before the headache started: _________________________________________________________________________________________ Symptoms during the headache: _______________________________________________________________________________________________ Treatment: ________________________________________________________________________________________________________________ Effect of treatment: _________________________________________________________________________________________________________ Other comments: ___________________________________________________________________________________________________________ This information is not intended to replace advice given to you by your health care provider. Make sure you discuss any questions you have with your health care provider. Document Revised: 11/21/2020 Document Reviewed: 11/21/2020 Elsevier Patient Education  2023 [employer]  

## 2022-11-04 ENCOUNTER — Telehealth: Payer: Self-pay | Admitting: Internal Medicine

## 2022-11-04 DIAGNOSIS — R7989 Other specified abnormal findings of blood chemistry: Secondary | ICD-10-CM

## 2022-11-04 DIAGNOSIS — E782 Mixed hyperlipidemia: Secondary | ICD-10-CM

## 2022-11-04 LAB — COMPLETE METABOLIC PANEL WITH GFR
AG Ratio: 1.4 (calc) (ref 1.0–2.5)
ALT: 18 U/L (ref 6–29)
AST: 16 U/L (ref 10–35)
Albumin: 4.2 g/dL (ref 3.6–5.1)
Alkaline phosphatase (APISO): 69 U/L (ref 37–153)
BUN: 11 mg/dL (ref 7–25)
CO2: 23 mmol/L (ref 20–32)
Calcium: 9.4 mg/dL (ref 8.6–10.4)
Chloride: 104 mmol/L (ref 98–110)
Creat: 0.81 mg/dL (ref 0.50–1.03)
Globulin: 3 g/dL (calc) (ref 1.9–3.7)
Glucose, Bld: 108 mg/dL — ABNORMAL HIGH (ref 65–99)
Potassium: 4.5 mmol/L (ref 3.5–5.3)
Sodium: 138 mmol/L (ref 135–146)
Total Bilirubin: 0.7 mg/dL (ref 0.2–1.2)
Total Protein: 7.2 g/dL (ref 6.1–8.1)
eGFR: 84 mL/min/{1.73_m2} (ref >=60)

## 2022-11-04 LAB — CBC
HCT: 44.7 % (ref 35.0–45.0)
Hemoglobin: 15 g/dL (ref 11.7–15.5)
MCH: 32.1 pg (ref 27.0–33.0)
MCHC: 33.6 g/dL (ref 32.0–36.0)
MCV: 95.5 fL (ref 80.0–100.0)
MPV: 11.6 fL (ref 7.5–12.5)
Platelets: 257 10*3/uL (ref 140–400)
RBC: 4.68 10*6/uL (ref 3.80–5.10)
RDW: 12.2 % (ref 11.0–15.0)
WBC: 5.9 10*3/uL (ref 3.8–10.8)

## 2022-11-04 LAB — LIPID PANEL
Cholesterol: 288 mg/dL — ABNORMAL HIGH (ref <200)
HDL: 52 mg/dL (ref >=50)
LDL Cholesterol (Calc): 201 mg/dL (calc) — ABNORMAL HIGH
Non-HDL Cholesterol (Calc): 236 mg/dL (calc) — ABNORMAL HIGH (ref <130)
Total CHOL/HDL Ratio: 5.5 (calc) — ABNORMAL HIGH (ref <5.0)
Triglycerides: 176 mg/dL — ABNORMAL HIGH (ref <150)

## 2022-11-04 LAB — TSH: TSH: 5.24 mIU/L — ABNORMAL HIGH (ref 0.40–4.50)

## 2022-11-04 LAB — HIV ANTIBODY (ROUTINE TESTING W REFLEX): HIV 1&2 Ab, 4th Generation: NONREACTIVE

## 2022-11-04 LAB — HEMOGLOBIN A1C
Hgb A1c MFr Bld: 5.9 % of total Hgb — ABNORMAL HIGH (ref <5.7)
Mean Plasma Glucose: 123 mg/dL
eAG (mmol/L): 6.8 mmol/L

## 2022-11-04 LAB — HEPATITIS C ANTIBODY: Hepatitis C Ab: NONREACTIVE

## 2022-11-04 MED ORDER — ATORVASTATIN CALCIUM 10 MG PO TABS: 10.0000 mg | ORAL_TABLET | Freq: Every day | ORAL | 1 refills | Status: DC

## 2022-11-04 MED ORDER — AMITRIPTYLINE HCL 10 MG PO TABS: 10.0000 mg | ORAL_TABLET | Freq: Every day | ORAL | 1 refills | Status: DC

## 2022-11-04 NOTE — Telephone Encounter (Signed)
Pt advised.  She agreed to start Atorvastatin.  Lab only apt is scheduled for 12/11/2022.  She would also like to try amitriptyline nightly.    She reported that earlier today she had a spell of lightheadedness and an "aura".  She states it only last a few moments and after drinking two large glasses of water she felt a lot better.  I advised her to make sure she stays well hydrated.  She stated that if she symptoms return or get worse she will call back and schedule an appointment to be seen.   Thanks,   -Vernona Rieger

## 2022-11-04 NOTE — Telephone Encounter (Signed)
Pt is calling to respond back to Debra Gonzalez's Mychart Lab results. Pts states that her mother has struggled with thyroid concerns. But she has never been told that she has thyroid concerns but she is not surprised.   And patient would like to control/ work on her  cholesterol with diet for 6 moths. However if Rene Kocher feel necessary to start to cholesterol medication. Pt is fine to start now.  And ok to please send headache medication to the pharmacy

## 2022-11-04 NOTE — Addendum Note (Signed)
Addended by: Lorre Munroe on: 11/04/2022 11:10 AM   Modules accepted: Orders

## 2022-11-04 NOTE — Telephone Encounter (Signed)
Given how high her LDL is, I would not recommend waiting.  I have sent atorvastatin to her pharmacy.  Please have her schedule a lab only appointment in 6 weeks to repeat cholesterol and thyroid studies

## 2022-11-04 NOTE — Addendum Note (Signed)
Addended by: Lorre Munroe on: 11/04/2022 02:18 PM   Modules accepted: Orders

## 2022-11-04 NOTE — Telephone Encounter (Signed)
I have sent amitriptyline to her pharmacy

## 2022-11-07 ENCOUNTER — Telehealth: Payer: Self-pay

## 2022-11-07 NOTE — Telephone Encounter (Signed)
Patient has been contacted to schedule her colonoscopy.  She has requested to wait a few months before scheduling due to moving and possibly starting a new job.  A reminder letter will be mailed to her to call office when she is ready to schedule.  Thanks,  [place], [place]

## 2022-11-10 ENCOUNTER — Ambulatory Visit: Payer: Self-pay

## 2022-11-10 MED ORDER — ROSUVASTATIN CALCIUM 10 MG PO TABS: 10.0000 mg | ORAL_TABLET | ORAL | 1 refills | Status: DC

## 2022-11-10 MED ORDER — DULOXETINE HCL 30 MG PO CPEP: 30.0000 mg | ORAL_CAPSULE | Freq: Every day | ORAL | 1 refills | Status: DC

## 2022-11-10 NOTE — Telephone Encounter (Signed)
Pt advised.   Thanks,   -Jaion Lagrange  

## 2022-11-10 NOTE — Telephone Encounter (Signed)
Chief Complaint: Medication reaction to Atorvastatin and Amitriptyline Symptoms: Atorvastatin caused diarrhea; Amitriptyline caused chest tightness, heart racing, tingling, cold sweats Frequency: Saturday night Pertinent Negatives: Patient denies symptoms at present time Disposition: [] ED /[] Urgent Care (no appt availability in office) / [] Appointment(In office/virtual)/ []  [facility] [facility]/ [] Home Care/ [] Refused Recommended Disposition /[] [place] [facility]/ [x]  Follow-up with PCP Additional Notes: Patient says 3 days after taking atorvastatin she had diarrhea on Saturday. Saturday night she took 1 pill of amitriptyline and she woke up with chest tightness, cold sweats, heart racing, tingling all over; took 2 baby aspirin and went back to sleep and in 1 hours same symptoms, so she got out the bed and sat up the rest of the night (early morning on Sunday). She says that her mom takes Duloxetine and Ezetimibe. She asks if she could try these 2 medications, low dose of Duloxetine for headache. Advised I will send this to [facility] for review and someone will call back with her recommendation.    Reason for Disposition  [1] Caller has NON-URGENT medicine question about med that PCP prescribed AND [2] triager unable to answer question  Answer Assessment - Initial Assessment Questions 1. NAME of MEDICINE: "What medicine(s) are you calling about?"     Lipitor, amitriptyline 2. QUESTION: "What is your question?" (e.g., double dose of medicine, side effect)     N/A 3. PRESCRIBER: "Who prescribed the medicine?" Reason: if prescribed by specialist, call should be referred to that group.     [place] 4. SYMPTOMS: "Do you have any symptoms?" If Yes, ask: "What symptoms are you having?"  "How bad are the symptoms (e.g., mild, moderate, severe)     Diarrhea, chest tightness, heart racing, tingling, cold sweats  Protocols used: Medication Question Call-A-AH

## 2022-11-10 NOTE — Addendum Note (Signed)
Addended by: Lorre Munroe on: 11/10/2022 12:35 PM   Modules accepted: Orders

## 2022-11-10 NOTE — Telephone Encounter (Signed)
Zetia will help lower the triglycerides but not the LDL and that is my biggest concern.  Her LDL is 200 and it should be less than 100.  Would she be willing to retry the atorvastatin 3 times a week and see if it causes same symptoms?  I will send in duloxetine in place of amitriptyline.

## 2022-11-10 NOTE — Telephone Encounter (Signed)
I have sent rosuvastatin in place atorvastatin

## 2022-11-10 NOTE — Addendum Note (Signed)
Addended by: Lorre Munroe on: 11/10/2022 03:10 PM   Modules accepted: Orders

## 2022-11-10 NOTE — Telephone Encounter (Signed)
Pt advised.  She agreed to try atrovastatin 3 days a week but she was wondering if she could try rosuvastatin instead first.  She says her mom did well with "Water soluble" cholesterol medicine instead of "Fat soluble".     Thanks,    -Vernona Rieger

## 2022-12-11 ENCOUNTER — Other Ambulatory Visit: Payer: BC Managed Care – PPO

## 2022-12-11 DIAGNOSIS — R7989 Other specified abnormal findings of blood chemistry: Secondary | ICD-10-CM | POA: Diagnosis not present

## 2022-12-11 DIAGNOSIS — E782 Mixed hyperlipidemia: Secondary | ICD-10-CM | POA: Diagnosis not present

## 2022-12-12 LAB — COMPLETE METABOLIC PANEL WITH GFR
AG Ratio: 1.7 (calc) (ref 1.0–2.5)
ALT: 21 U/L (ref 6–29)
AST: 17 U/L (ref 10–35)
Albumin: 4.2 g/dL (ref 3.6–5.1)
Alkaline phosphatase (APISO): 61 U/L (ref 37–153)
BUN: 14 mg/dL (ref 7–25)
CO2: 25 mmol/L (ref 20–32)
Calcium: 9.1 mg/dL (ref 8.6–10.4)
Chloride: 105 mmol/L (ref 98–110)
Creat: 0.78 mg/dL (ref 0.50–1.03)
Globulin: 2.5 g/dL (calc) (ref 1.9–3.7)
Glucose, Bld: 127 mg/dL — ABNORMAL HIGH (ref 65–99)
Potassium: 4.3 mmol/L (ref 3.5–5.3)
Sodium: 139 mmol/L (ref 135–146)
Total Bilirubin: 1 mg/dL (ref 0.2–1.2)
Total Protein: 6.7 g/dL (ref 6.1–8.1)
eGFR: 88 mL/min/{1.73_m2} (ref >=60)

## 2022-12-12 LAB — LIPID PANEL
Cholesterol: 202 mg/dL — ABNORMAL HIGH (ref <200)
HDL: 46 mg/dL — ABNORMAL LOW (ref >=50)
LDL Cholesterol (Calc): 134 mg/dL (calc) — ABNORMAL HIGH
Non-HDL Cholesterol (Calc): 156 mg/dL (calc) — ABNORMAL HIGH (ref <130)
Total CHOL/HDL Ratio: 4.4 (calc) (ref <5.0)
Triglycerides: 114 mg/dL (ref <150)

## 2022-12-12 LAB — TSH+FREE T4: TSH W/REFLEX TO FT4: 2.81 mIU/L (ref 0.40–4.50)

## 2023-02-05 ENCOUNTER — Emergency Department: Payer: 59

## 2023-02-05 ENCOUNTER — Emergency Department
Admission: EM | Admit: 2023-02-05 | Discharge: 2023-02-05 | Disposition: A | Discharge status: Home / Self Care | Payer: 59 | Attending: Emergency Medicine | Admitting: Emergency Medicine

## 2023-02-05 ENCOUNTER — Ambulatory Visit
Admission: EM | Admit: 2023-02-05 | Discharge: 2023-02-05 | Disposition: A | Discharge status: Home / Self Care | Payer: 59

## 2023-02-05 ENCOUNTER — Ambulatory Visit: Payer: Self-pay

## 2023-02-05 DIAGNOSIS — G43909 Migraine, unspecified, not intractable, without status migrainosus: Secondary | ICD-10-CM

## 2023-02-05 DIAGNOSIS — Z1152 Encounter for screening for COVID-19: Secondary | ICD-10-CM | POA: Diagnosis not present

## 2023-02-05 DIAGNOSIS — G43811 Other migraine, intractable, with status migrainosus: Secondary | ICD-10-CM | POA: Diagnosis not present

## 2023-02-05 DIAGNOSIS — R0789 Other chest pain: Secondary | ICD-10-CM

## 2023-02-05 DIAGNOSIS — I493 Ventricular premature depolarization: Secondary | ICD-10-CM | POA: Insufficient documentation

## 2023-02-05 LAB — CBC
HCT: 42.5 % (ref 36.0–46.0)
Hemoglobin: 14.6 g/dL (ref 12.0–15.0)
MCH: 31.9 pg (ref 26.0–34.0)
MCHC: 34.4 g/dL (ref 30.0–36.0)
MCV: 93 fL (ref 80.0–100.0)
Platelets: 268 10*3/uL (ref 150–400)
RBC: 4.57 MIL/uL (ref 3.87–5.11)
RDW: 12.4 % (ref 11.5–15.5)
WBC: 7 10*3/uL (ref 4.0–10.5)
nRBC: 0 % (ref 0.0–0.2)

## 2023-02-05 LAB — BASIC METABOLIC PANEL
Anion gap: 9 (ref 5–15)
BUN: 14 mg/dL (ref 6–20)
CO2: 25 mmol/L (ref 22–32)
Calcium: 9.1 mg/dL (ref 8.9–10.3)
Chloride: 104 mmol/L (ref 98–111)
Creatinine, Ser: 0.82 mg/dL (ref 0.44–1.00)
GFR, Estimated: >60 mL/min (ref >60)
Glucose, Bld: 111 mg/dL — ABNORMAL HIGH (ref 70–99)
Potassium: 4.4 mmol/L (ref 3.5–5.1)
Sodium: 138 mmol/L (ref 135–145)

## 2023-02-05 LAB — SARS CORONAVIRUS 2 BY RT PCR: SARS Coronavirus 2 by RT PCR: NEGATIVE

## 2023-02-05 LAB — TSH: TSH: 4.93 u[IU]/mL — ABNORMAL HIGH (ref 0.350–4.500)

## 2023-02-05 LAB — TROPONIN I (HIGH SENSITIVITY)
Troponin I (High Sensitivity): 3 ng/L (ref <18)
Troponin I (High Sensitivity): 3 ng/L (ref <18)

## 2023-02-05 MED ORDER — KETOROLAC TROMETHAMINE 15 MG/ML IJ SOLN
15.0000 mg | Freq: Once | INTRAMUSCULAR | Status: AC
  Administered 2023-02-05: 15 mg via INTRAVENOUS
  Filled 2023-02-05: qty 1

## 2023-02-05 MED ORDER — ONDANSETRON 4 MG PO TBDP: 4.0000 mg | ORAL_TABLET | Freq: Three times a day (TID) | ORAL | 0 refills | Status: DC | PRN

## 2023-02-05 MED ORDER — SODIUM CHLORIDE 0.9 % IV BOLUS
500.0000 mL | Freq: Once | INTRAVENOUS | Status: AC
  Administered 2023-02-05: 500 mL via INTRAVENOUS

## 2023-02-05 MED ORDER — PROCHLORPERAZINE EDISYLATE 10 MG/2ML IJ SOLN
10.0000 mg | Freq: Once | INTRAMUSCULAR | Status: AC
  Administered 2023-02-05: 10 mg via INTRAVENOUS
  Filled 2023-02-05: qty 2

## 2023-02-05 NOTE — ED Provider Notes (Addendum)
Debra Gonzalez    CSN: [id number] Arrival date & time: 02/05/23  1059      History   Chief Complaint Chief Complaint  Patient presents with   Headache    HPI Debra Gonzalez is a 57 y.o. female.   57 year old female, Debra Gonzalez, presents to urgent care for evaluation of headaches and chest fullness, she states the chest fullness started today and that it radiates to the left little bit, patient denies chest pain per se palpitations or shortness of breath. No meds taken PTA.  Patient recently stopped taking her duloxetine 3 weeks prior and has had insomnia, patient also has past medical history of migraines.  The history is provided by the patient. No language interpreter was used.    Past Medical History:  Diagnosis Date   Asthma    Hyperlipidemia    Reactive airway disease     Patient Active Problem List   Diagnosis Date Noted   Chest tightness 02/05/2023   Migraine 02/05/2023   Frequent PVCs 02/05/2023   Frequent headaches 11/03/2022   Class 3 severe obesity due to excess calories with body mass index (BMI) of 45.0 to 49.9 in adult [facility]) 11/03/2022   Asthma 12/12/2019    Past Surgical History:  Procedure Laterality Date   CHOLECYSTECTOMY     TONSILLECTOMY      OB History   No obstetric history on file.      Home Medications    Prior to Admission medications   Medication Sig Start Date End Date Taking? Authorizing Provider  acetaminophen (TYLENOL) 500 MG tablet Take 500 mg by mouth every 6 (six) hours as needed.    [provider]  albuterol (PROVENTIL) (2.5 MG/3ML) 0.083% nebulizer solution Take 3 mLs (2.5 mg total) by nebulization every 6 (six) hours as needed for wheezing or shortness of breath. 06/18/21   Ratcliffe, Heather R, PA-C  albuterol (VENTOLIN HFA) 108 (90 Base) MCG/ACT inhaler Inhale 2 puffs into the lungs every 6 (six) hours as needed for wheezing or shortness of breath. 11/03/22   Lorre Munroe, NP  b complex vitamins  tablet Take 1 tablet by mouth daily.    [provider]  Biotin 1000 MCG tablet Take 1,000 mcg by mouth 3 (three) times daily.    [provider]  cetirizine (ZYRTEC) 10 MG tablet Take 10 mg by mouth daily.    [provider]  Cholecalciferol (VITAMIN D-3) 5000 UNIT/ML LIQD Place under the tongue.    [provider]  DULoxetine (CYMBALTA) 30 MG capsule Take 1 capsule (30 mg total) by mouth daily. Patient not taking: Reported on 02/05/2023 11/10/22   Lorre Munroe, NP  fluticasone [facility]) 50 MCG/ACT nasal spray Place 2 sprays into both nostrils daily.    [provider]  fluticasone-salmeterol (ADVAIR HFA) 230-21 MCG/ACT inhaler Inhale 2 puffs into the lungs 2 (two) times daily. 06/18/21   Ratcliffe, Heather R, PA-C  ibuprofen (ADVIL) 200 MG tablet Take 200 mg by mouth every 6 (six) hours as needed.    [provider]  Magnesium 100 MG TABS Take by mouth.    [provider]  Multiple Vitamin (MULTIVITAMIN) tablet Take 1 tablet by mouth daily.    [provider]  promethazine (PHENERGAN) 25 MG tablet Take 1 tablet (25 mg total) by mouth every 6 (six) hours as needed for nausea or vomiting. 10/23/22   Immordino, Jeannett Senior, FNP  rosuvastatin (CRESTOR) 10 MG tablet Take 1 tablet (10 mg total) by  mouth 3 (three) times a week. 11/10/22   Lorre Munroe, NP    Family History Family History  Problem Relation Age of Onset   Mitral valve prolapse Mother    Depression Mother    Hyperlipidemia Mother    Hypertension Mother    Melanoma Father    Hyperlipidemia Maternal Grandmother    Hypertension Maternal Grandmother    Diabetes Maternal Grandfather    Hyperlipidemia Paternal Grandmother    Stroke Paternal Grandmother    Bone cancer Paternal Grandfather     Social History Social History   Tobacco Use   Smoking status: Never   Smokeless tobacco: Never  Vaping Use   Vaping status: Never Used  Substance Use Topics   Alcohol  use: Yes    Alcohol/week: 1.0 standard drink of alcohol    Types: 1 Glasses of wine per week    Comment: weekly   Drug use: No     Allergies   Sulfa antibiotics, Cinnamon, Codeine, Corn-containing products, Spinach, and Wheat   Review of Systems Review of Systems  Constitutional:  Negative for fever.  Respiratory:  Positive for chest tightness. Negative for cough, shortness of breath and wheezing.   Cardiovascular:  Negative for chest pain and palpitations.  Neurological:  Positive for headaches. Negative for dizziness.  All other systems reviewed and are negative.    Physical Exam Triage Vital Signs ED Triage Vitals [02/05/23 1106]  Encounter Vitals Group     BP 136/85     Systolic BP Percentile      Diastolic BP Percentile      Pulse Rate 99     Resp 18     Temp 99 F (37.2 C)     Temp src      SpO2 96 %     Weight      Height      Head Circumference      Peak Flow      Pain Score      Pain Loc      Pain Education      Exclude from Growth Chart    No data found.  Updated Vital Signs BP 136/85   Pulse 99   Temp 99 F (37.2 C)   Resp 18   LMP  (LMP Unknown)   SpO2 96%   Visual Acuity Right Eye Distance:   Left Eye Distance:   Bilateral Distance:    Right Eye Near:   Left Eye Near:    Bilateral Near:     Physical Exam Vitals and nursing note reviewed.  Constitutional:      General: She is not in acute distress.    Appearance: She is well-developed.  HENT:     Head: Normocephalic and atraumatic.  Eyes:     General: Vision grossly intact.     Conjunctiva/sclera: Conjunctivae normal.     Pupils: Pupils are equal, round, and reactive to light.  Cardiovascular:     Rate and Rhythm: Normal rate and regular rhythm.     Pulses: Normal pulses.     Heart sounds: Normal heart sounds. No murmur heard. Pulmonary:     Effort: Pulmonary effort is normal. No respiratory distress.     Breath sounds: Normal breath sounds and air entry.  Abdominal:      Palpations: Abdomen is soft.     Tenderness: There is no abdominal tenderness.  Musculoskeletal:        General: No swelling.     Cervical back: Neck  supple.  Skin:    General: Skin is warm and dry.     Capillary Refill: Capillary refill takes less than 2 seconds.  Neurological:     General: No focal deficit present.     Mental Status: She is alert and oriented to person, place, and time.     GCS: GCS eye subscore is 4. GCS verbal subscore is 5. GCS motor subscore is 6.     Cranial Nerves: No cranial nerve deficit.     Sensory: No sensory deficit.  Psychiatric:        Attention and Perception: Attention normal.        Mood and Affect: Mood normal.        Speech: Speech normal.        Behavior: Behavior normal. Behavior is cooperative.        Thought Content: Thought content normal.      UC Treatments / Results  Labs (all labs ordered are listed, but only abnormal results are displayed) Labs Reviewed - No data to display  EKG   Radiology No results found.  Procedures Procedures (including critical care time)  Medications Ordered in UC Medications - No data to display  Initial Impression / Assessment and Plan / UC Course  I have reviewed the triage vital signs and the nursing notes.  Pertinent labs & imaging results that were available during my care of the patient were reviewed by me and considered in my medical decision making (see chart for details).  Clinical Course as of 02/05/23 1159  Thu Feb 05, 2023  1126 EKG shows sinus rhythm with frequent PVC's, QTc is 769. Pt does not have history of PVC's, in setting with chest tightness, will send to Er for further evaluation. Held off on 4 baby ASA as corn containing products cause throat swelling and was flagged with administration of baby ASA. Pt verbalized understanding to this provider. [JD]    Clinical Course User Index [JD] Tniyah Nakagawa, Para March, NP    Ddx: Migraine, atypical chest pain, medication withdrawal,  asthma,viral illness Final Clinical Impressions(s) / UC Diagnoses   Final diagnoses:  Other migraine with status migrainosus, intractable  Chest tightness  Frequent PVCs     Discharge Instructions      Go to Er for further evaluation of migraine headaches, chest tightness with frequent PVC's.      ED Prescriptions   None    PDMP not reviewed this encounter.   Clancy Gourd, NP 02/05/23 1200    Clancy Gourd, NP 02/05/23 1201

## 2023-02-05 NOTE — Telephone Encounter (Signed)
Chief Complaint: Chest tightness Symptoms: chest tightness, migraine headache Frequency: comes and goes  Pertinent Negatives: Patient denies nausea, vomiting, fever Disposition: [] ED /[] Urgent Care (no appt availability in office) / [x] Appointment(In office/virtual)/ []  [facility]/ [] Home Care/ [] Refused Recommended Disposition /[] [facility]/ []  Follow-up with PCP Additional Notes: Patient stated that she stopped taking her prescribed duloxetine about 2 weeks ago. Since than she has had reoccurring migraine headaches and today she has felt some chest tightness that comes and goes. Patient reports chest tightness 6/10 but does not describe it as true chest pain. Advised patient that she will need to be evaluated. Patient is agreeable and asked if she can have an Rx for migraines until her scheduled appointment. Advised patient that I would forward request to provider for recommendations and a MyChart video visit has been scheduled for 02/09/23 at 1600. Patient believes these may be symptoms of Duloxetine withdrawals. Advised patient if symptoms get worse to go to the UC or ED. Patient verbalized understanding. Reason for Disposition  [1] Chest pain(s) lasting a few seconds AND [2] persists > 3 days  Answer Assessment - Initial Assessment Questions 1. LOCATION: "Where does it hurt?"       Center of the chest  2. RADIATION: "Does the pain go anywhere else?" (e.g., into neck, jaw, arms, back)     Radiates to the left of the chest  3. ONSET: "When did the chest pain begin?" (Minutes, hours or days)      This morning  4. PATTERN: "Does the pain come and go, or has it been constant since it started?"  "Does it get worse with exertion?"      Come and going  5. DURATION: "How long does it last" (e.g., seconds, minutes, hours)     This is the first time that it has happened so I am not really sure  6. SEVERITY: "How bad is the pain?"  (e.g., Scale 1-10; mild, moderate, or severe)     - MILD (1-3): doesn't interfere with normal activities     - MODERATE (4-7): interferes with normal activities or awakens from sleep    - SEVERE (8-10): excruciating pain, unable to do any normal activities       6/10 7. CARDIAC RISK FACTORS: "Do you have any history of heart problems or risk factors for heart disease?" (e.g., angina, prior heart attack; diabetes, high blood pressure, high cholesterol, smoker, or strong family history of heart disease)     High cholesterol 8. PULMONARY RISK FACTORS: "Do you have any history of lung disease?"  (e.g., blood clots in lung, asthma, emphysema, birth control pills)     Asthma, 9. CAUSE: "What do you think is causing the chest pain?"     Duloxetine I stopped taking it about 2 weeks ago 10. OTHER SYMPTOMS: "Do you have any other symptoms?" (e.g., dizziness, nausea, vomiting, sweating, fever, difficulty breathing, cough)       Headache  Protocols used: Chest Pain-A-AH

## 2023-02-05 NOTE — ED Notes (Signed)
Covid swab sent to lab.

## 2023-02-05 NOTE — ED Provider Notes (Signed)
[facility] Provider Note    Event Date/Time   First MD Initiated Contact with Patient 02/05/23 1414     (approximate)   History   Chest Pain and Migraine   HPI  Debra Gonzalez is a 57 y.o. female past medical history significant for migraine headaches, who presents to the emergency department with chest pain.  Patient states that she was taking amitriptyline for preventative migraine headaches, felt that this was causing mental fogginess so she self discontinued this medication 2 weeks ago.  Was feeling okay the first week but has not been feeling well this past week.  States that she has been experiencing worsening headache.  Today was having some chest tightness and heart palpitations.  Did drink and nondecaf coffee this morning.  Denies any shortness of breath.  Denies any lower extremity edema.  No history of DVT or PE.  Went to urgent care and was told she had frequent PVCs and to be evaluated at the emergency department.  Currently complaining of a migraine headache.  Rates it as 7/10.  Consistent with her prior migraine headaches.  No change in vision.  No neck stiffness.  No fever or chills.  This is consistent with prior migraine headaches.     Physical Exam   Triage Vital Signs: ED Triage Vitals [02/05/23 1236]  Encounter Vitals Group     BP 139/82     Systolic BP Percentile      Diastolic BP Percentile      Pulse Rate 88     Resp 18     Temp 98.5 F (36.9 C)     Temp Source Oral     SpO2 98 %     Weight (!) 320 lb (145.2 kg)     Height 5\' 10"  (1.778 m)     Head Circumference      Peak Flow      Pain Score 9     Pain Loc      Pain Education      Exclude from Growth Chart     Most recent vital signs: Vitals:   02/05/23 1500 02/05/23 1535  BP: (!) 130/102 127/78  Pulse: 82 79  Resp: 19 13  Temp:    SpO2: 100% 99%    Physical Exam Constitutional:      Appearance: She is well-developed.  HENT:     Head: Atraumatic.  Eyes:      Conjunctiva/sclera: Conjunctivae normal.  Cardiovascular:     Rate and Rhythm: Regular rhythm.     Heart sounds: No murmur heard. Pulmonary:     Effort: No respiratory distress.  Abdominal:     General: There is no distension.  Musculoskeletal:        General: Normal range of motion.     Cervical back: Normal range of motion.  Skin:    General: Skin is warm.  Neurological:     Mental Status: She is alert. Mental status is at baseline.     GCS: GCS eye subscore is 4. GCS verbal subscore is 5. GCS motor subscore is 6.     Cranial Nerves: Cranial nerves 2-12 are intact.     Sensory: Sensation is intact.     Motor: Motor function is intact.     IMPRESSION / MDM / ASSESSMENT AND PLAN / ED COURSE  I reviewed the triage vital signs and the nursing notes.  Differential diagnosis including ACS, dysrhythmia, hyperthyroid, COVID, pneumonia, medication side effect, migraine headache.  Have a low suspicion for intracranial hemorrhage or infarction.  Normal neurologic exam.  No change in vision or concern for giant cell arteritis.  No signs of meningitis.  EKG  I, Corena Herter, the attending physician, personally viewed and interpreted this ECG.   Rate: Normal  Rhythm: Normal sinus  Axis: Normal  Intervals: Normal  ST&T Change: None Frequent PVCs.  Frequent PVCs while on cardiac telemetry.  RADIOLOGY  my interpretation of imaging: No signs of pneumonia  LABS (all labs ordered are listed, but only abnormal results are displayed) Labs interpreted as -    Labs Reviewed  BASIC METABOLIC PANEL - Abnormal; Notable for the following components:      Result Value   Glucose, Bld 111 (*)    All other components within normal limits  TSH - Abnormal; Notable for the following components:   TSH 4.930 (*)    All other components within normal limits  SARS CORONAVIRUS 2 BY RT PCR  CBC  TROPONIN I (HIGH SENSITIVITY)  TROPONIN I (HIGH SENSITIVITY)     MDM    Low risk Wells  criteria, have a low suspicion for pulmonary embolism, no shortness of breath and no pleuritic chest pain.  Serial troponin negative with no concern for ACS.  Low risk heart score.  Does have frequent PVCs.  No signs of pneumonia on chest x-ray.  Discussed avoiding any caffeine.  Discussed close follow-up with primary care physician.  Given treatment for migraine headache  Discussed close follow-up with primary care provider   PROCEDURES:  Critical Care performed: No  Procedures  Patient's presentation is most consistent with acute complicated illness / injury requiring diagnostic workup.   MEDICATIONS ORDERED IN ED: Medications  sodium chloride 0.9 % bolus 500 mL (500 mLs Intravenous New Bag/Given 02/05/23 1518)  prochlorperazine (COMPAZINE) injection 10 mg (10 mg Intravenous Given 02/05/23 1520)  ketorolac (TORADOL) 15 MG/ML injection 15 mg (15 mg Intravenous Given 02/05/23 1520)    FINAL CLINICAL IMPRESSION(S) / ED DIAGNOSES   Final diagnoses:  Atypical chest pain  PVC (premature ventricular contraction)     Rx / DC Orders   ED Discharge Orders          Ordered    ondansetron (ZOFRAN-ODT) 4 MG disintegrating tablet  Every 8 hours PRN        02/05/23 1531             Note:  This document was prepared using Dragon voice recognition software and may include unintentional dictation errors.   Corena Herter, MD 02/05/23 1547

## 2023-02-05 NOTE — Telephone Encounter (Addendum)
Pt advised.  She does not want to start duloxetine at this time.  She went to the ER for her Migraine and will follow up on Monday 08/05.   Thanks,   -Vernona Rieger

## 2023-02-05 NOTE — Telephone Encounter (Signed)
I don't want to give her migraine meds without evaluation. She can try Excedrin. Does she want to restart her Duloxetine?

## 2023-02-05 NOTE — ED Triage Notes (Signed)
Pt presents to the ED POV from home. Pt states that she went to UC for a migraine that started about 3 days ago. Pt reported chest tightness that started this morning at 4am. UC sent her here for cardiac workup. Pt states that she had an EKG done there that showed frequent PVC's. Pt A&Ox4 at time of triage. VSS. EKG completed.

## 2023-02-05 NOTE — Discharge Instructions (Addendum)
You are seen in the emergency department for chest pain.  Your heart enzymes were normal, you are not having a heart attack today.  You did have frequent PVCs on your EKG.  Do not drink any caffeine.  Call and follow-up closely with your primary care physician.  You are given a prescription for nausea medication.  zofran (ondansetron) - nausea medication, take 1 tablet every 8 hours as needed for nausea/vomiting.

## 2023-02-05 NOTE — ED Triage Notes (Addendum)
Patient to Urgent Care with complaints of headaches- light senstivity. Reports today started experiencing chest fullness, states it radiates a little to the left. Denies any shortness of breath.   Believes her symptoms could be related to duloxetine. States she stopped taking this when she started her new job. States she cut out her duloxetine cold [place] 3 weeks ago.

## 2023-02-05 NOTE — Discharge Instructions (Signed)
Go to Er for further evaluation of migraine headaches, chest tightness with frequent PVC's.

## 2023-02-05 NOTE — ED Notes (Signed)
ED Provider at bedside. 

## 2023-02-05 NOTE — ED Notes (Signed)
Patient is being discharged from the Urgent Care and sent to the Emergency Department via POV . Per Clancy Gourd NP, patient is in need of higher level of care due to abnormal EKG/ chest tightness. Patient is aware and verbalizes understanding of plan of care.  Vitals:   02/05/23 1106  BP: 136/85  Pulse: 99  Resp: 18  Temp: 99 F (37.2 C)  SpO2: 96%

## 2023-02-09 ENCOUNTER — Telehealth: Payer: 59 | Admitting: Internal Medicine

## 2023-02-09 ENCOUNTER — Other Ambulatory Visit: Payer: Self-pay | Admitting: Internal Medicine

## 2023-02-09 ENCOUNTER — Encounter: Payer: Self-pay | Admitting: Internal Medicine

## 2023-02-09 DIAGNOSIS — G43919 Migraine, unspecified, intractable, without status migrainosus: Secondary | ICD-10-CM

## 2023-02-09 DIAGNOSIS — I493 Ventricular premature depolarization: Secondary | ICD-10-CM

## 2023-02-09 DIAGNOSIS — E039 Hypothyroidism, unspecified: Secondary | ICD-10-CM | POA: Diagnosis not present

## 2023-02-09 DIAGNOSIS — R0789 Other chest pain: Secondary | ICD-10-CM

## 2023-02-09 MED ORDER — LEVOTHYROXINE SODIUM 25 MCG PO TABS: 25.0000 ug | ORAL_TABLET | Freq: Every day | ORAL | 0 refills | Status: DC

## 2023-02-09 NOTE — Patient Instructions (Signed)

## 2023-02-09 NOTE — Progress Notes (Signed)
Virtual Visit via Video Note  I connected with Debra Gonzalez on 02/09/23 at  4:00 PM EDT by a video enabled telemedicine application and verified that I am speaking with the correct person using two identifiers.  Location: Patient: Home Provider: Office  Persons participating in this video call: Nicki Reaper, NP and Damaris Schooner   I discussed the limitations of evaluation and management by telemedicine and the availability of in person appointments. The patient expressed understanding and agreed to proceed.  History of Present Illness:  Patient due for ER follow-up.  She presented to the ER 8/1 after being advised by the urgent care to go for evaluation of frequent PVCs which she had in the past.  The difference with this episode is that she was having some left-sided chest pressure.  She was also having a migraine which she has a history of.  She did stop her duloxetine 3 weeks prior.  ECG showed frequent PVCs.  TSH was marginally elevated although other labs were reassuring.  Troponins were negative.  She has never been diagnosed with a thyroid problem but reports her mother has hypothyroidism.  She was discharged and advised to follow-up with her PCP.  Since that time, she reports she has slept really well the last 2 nights.  She is had no recurrence of chest pressure.   Past Medical History:  Diagnosis Date   Asthma    Hyperlipidemia    Reactive airway disease     Current Outpatient Medications  Medication Sig Dispense Refill   acetaminophen (TYLENOL) 500 MG tablet Take 500 mg by mouth every 6 (six) hours as needed.     albuterol (PROVENTIL) (2.5 MG/3ML) 0.083% nebulizer solution Take 3 mLs (2.5 mg total) by nebulization every 6 (six) hours as needed for wheezing or shortness of breath. 150 mL 1   albuterol (VENTOLIN HFA) 108 (90 Base) MCG/ACT inhaler Inhale 2 puffs into the lungs every 6 (six) hours as needed for wheezing or shortness of breath. 18 g 0   b complex vitamins tablet  Take 1 tablet by mouth daily.     Biotin 1000 MCG tablet Take 1,000 mcg by mouth 3 (three) times daily.     cetirizine (ZYRTEC) 10 MG tablet Take 10 mg by mouth daily.     Cholecalciferol (VITAMIN D-3) 5000 UNIT/ML LIQD Place under the tongue.     DULoxetine (CYMBALTA) 30 MG capsule Take 1 capsule (30 mg total) by mouth daily. (Patient not taking: Reported on 02/05/2023) 90 capsule 1   fluticasone (FLONASE) 50 MCG/ACT nasal spray Place 2 sprays into both nostrils daily.     fluticasone-salmeterol (ADVAIR HFA) 230-21 MCG/ACT inhaler Inhale 2 puffs into the lungs 2 (two) times daily. 1 each 12   ibuprofen (ADVIL) 200 MG tablet Take 200 mg by mouth every 6 (six) hours as needed.     Magnesium 100 MG TABS Take by mouth.     Multiple Vitamin (MULTIVITAMIN) tablet Take 1 tablet by mouth daily.     ondansetron (ZOFRAN-ODT) 4 MG disintegrating tablet Take 1 tablet (4 mg total) by mouth every 8 (eight) hours as needed for nausea or vomiting. 30 tablet 0   promethazine (PHENERGAN) 25 MG tablet Take 1 tablet (25 mg total) by mouth every 6 (six) hours as needed for nausea or vomiting. 10 tablet 0   rosuvastatin (CRESTOR) 10 MG tablet Take 1 tablet (10 mg total) by mouth 3 (three) times a week. 36 tablet 1   Current Facility-Administered Medications  Medication  Dose Route Frequency Provider Last Rate Last Admin   0.9 %  sodium chloride infusion   Intravenous PRN Ratcliffe, Heather R, PA-C        Allergies  Allergen Reactions   Sulfa Antibiotics Nausea And Vomiting   Cinnamon     "eating too much causes lip swelling"   Codeine Nausea Only   Corn-Containing Products Other (See Comments)    Throat swelling   Spinach Swelling    If 'eats too much gets lip swelling'   Wheat     "eating too much causes lip swelling"    Family History  Problem Relation Age of Onset   Mitral valve prolapse Mother    Depression Mother    Hyperlipidemia Mother    Hypertension Mother    Melanoma Father     Hyperlipidemia Maternal Grandmother    Hypertension Maternal Grandmother    Diabetes Maternal Grandfather    Hyperlipidemia Paternal Grandmother    Stroke Paternal Grandmother    Bone cancer Paternal Grandfather     Social History   Socioeconomic History   Marital status: Divorced    Spouse name: Not on file   Number of children: Not on file   Years of education: Not on file   Highest education level: Not on file  Occupational History   Not on file  Tobacco Use   Smoking status: Never   Smokeless tobacco: Never  Vaping Use   Vaping status: Never Used  Substance and Sexual Activity   Alcohol use: Yes    Alcohol/week: 1.0 standard drink of alcohol    Types: 1 Glasses of wine per week    Comment: weekly   Drug use: No   Sexual activity: Not on file  Other Topics Concern   Not on file  Social History Narrative   Not on file   Social Determinants of Health   Financial Resource Strain: Not on file  Food Insecurity: Not on file  Transportation Needs: Not on file  Physical Activity: Not on file  Stress: Not on file  Social Connections: Not on file  Intimate Partner Violence: Not on file     Constitutional: Patient reports intermittent headaches.  Denies fever, malaise, fatigue, or abrupt weight changes.  HEENT: Denies eye pain, eye redness, ear pain, ringing in the ears, wax buildup, runny nose, nasal congestion, bloody nose, or sore throat. Respiratory: Denies difficulty breathing, shortness of breath, cough or sputum production.   Cardiovascular: Patient reports palpitations.  Denies chest pain, chest tightness, or swelling in the hands or feet.  Gastrointestinal: Denies abdominal pain, bloating, constipation, diarrhea or blood in the stool.  GU: Denies urgency, frequency, pain with urination, burning sensation, blood in urine, odor or discharge. Musculoskeletal: Denies decrease in range of motion, difficulty with gait, muscle pain or joint pain and swelling.  Skin:  Denies redness, rashes, lesions or ulcercations.  Neurological: Denies dizziness, difficulty with memory, difficulty with speech or problems with balance and coordination.  Psych: Denies anxiety, depression, SI/HI.  No other specific complaints in a complete review of systems (except as listed in HPI above).    Observations/Objective:   Wt Readings from Last 3 Encounters:  02/05/23 (!) 320 lb (145.2 kg)  11/03/22 (!) 335 lb (152 kg)  01/08/21 (!) 324 lb 6.4 oz (147.1 kg)    General: Appears her stated age, obese, in NAD. Pulmonary/Chest: Normal effort. No respiratory distress.  Abdomen: Soft and nontender. Normal bowel sounds. No distention or masses noted. Liver, spleen and kidneys  non palpable. Neurological: Alert and oriented. Coordination normal.    BMET    Component Value Date/Time   NA 138 02/05/2023 1241   K 4.4 02/05/2023 1241   CL 104 02/05/2023 1241   CO2 25 02/05/2023 1241   GLUCOSE 111 (H) 02/05/2023 1241   BUN 14 02/05/2023 1241   CREATININE 0.82 02/05/2023 1241   CREATININE 0.78 12/11/2022 0845   CALCIUM 9.1 02/05/2023 1241   GFRNONAA >60 02/05/2023 1241   GFRAA >60 11/24/2019 2056    Lipid Panel     Component Value Date/Time   CHOL 202 (H) 12/11/2022 0845   TRIG 114 12/11/2022 0845   HDL 46 (L) 12/11/2022 0845   CHOLHDL 4.4 12/11/2022 0845   LDLCALC 134 (H) 12/11/2022 0845    CBC    Component Value Date/Time   WBC 7.0 02/05/2023 1241   RBC 4.57 02/05/2023 1241   HGB 14.6 02/05/2023 1241   HCT 42.5 02/05/2023 1241   PLT 268 02/05/2023 1241   MCV 93.0 02/05/2023 1241   MCH 31.9 02/05/2023 1241   MCHC 34.4 02/05/2023 1241   RDW 12.4 02/05/2023 1241    Hgb A1C Lab Results  Component Value Date   HGBA1C 5.9 (H) 11/03/2022       Assessment and Plan:  ER follow-up for migraine, chest tightness,  PVCs, new onset hypothyroidism:  Urgent care and ER notes, labs and procedures reviewed Will start levothyroxine 25 mcg daily to treat the  hypothyroidism If symptoms persist or worsen, would consider propranolol for PVC management and migraine prevention   RTC in 2 months for annual exam  Follow Up Instructions:    I discussed the assessment and treatment plan with the patient. The patient was provided an opportunity to ask questions and all were answered. The patient agreed with the plan and demonstrated an understanding of the instructions.   The patient was advised to call back or seek an in-person evaluation if the symptoms worsen or if the condition fails to improve as anticipated.    Nicki Reaper, NP

## 2023-02-10 NOTE — Telephone Encounter (Signed)
Requested Prescriptions  Pending Prescriptions Disp Refills   levothyroxine (SYNTHROID) 25 MCG tablet [Pharmacy Med Name: LEVOTHYROXINE 0.025MG  ( ) TAB] 90 tablet     Sig: TAKE 1 TABLET(25 MCG) BY MOUTH DAILY     Endocrinology:  Hypothyroid Agents Failed - 02/09/2023  3:45 PM      Failed - TSH in normal range and within 360 days    TSH  Date Value Ref Range Status  02/05/2023 4.930 (H) 0.350 - 4.500 uIU/mL Final    Comment:    Performed by a 3rd Generation assay with a functional sensitivity of <=0.01 uIU/mL. Performed at [facility], [address] [place], [place] [address]   11/03/2022 5.24 (H) 0.40 - 4.50 mIU/L Final         Passed - Valid encounter within last 12 months    Recent Outpatient Visits           Yesterday Chest tightness   [facility] [place], Salvadore Oxford, NP   3 months ago Frequent headaches   [place] [facility] [place], Salvadore Oxford, NP       Future Appointments             In 2 months Baity, Salvadore Oxford, NP [place] [facility], [place]

## 2023-03-05 ENCOUNTER — Other Ambulatory Visit: Payer: Self-pay | Admitting: Internal Medicine

## 2023-03-05 NOTE — Telephone Encounter (Signed)
Medication Refill - Medication: levothyroxine (SYNTHROID) 25 MCG tablet   Has the patient contacted their pharmacy? No.  Preferred Pharmacy (with phone number or street name):  [id number] [place], [place] - [address] AT [facility] [address] Phone: [phone]  Fax: [phone]     Has the patient been seen for an appointment in the last year OR does the patient have an upcoming appointment? Yes.    Agent: Please be advised that RX refills may take up to 3 business days. We ask that you follow-up with your pharmacy.

## 2023-03-06 ENCOUNTER — Other Ambulatory Visit: Payer: Self-pay | Admitting: Internal Medicine

## 2023-03-06 MED ORDER — LEVOTHYROXINE SODIUM 25 MCG PO TABS: 25.0000 ug | ORAL_TABLET | Freq: Every day | ORAL | 0 refills | Status: DC

## 2023-03-06 NOTE — Telephone Encounter (Signed)
Requested by interface surescripts. Duplicate request. Receipt confirmed by pharmacy 03/06/23 at 11:24 am.  Requested Prescriptions  Refused Prescriptions Disp Refills   levothyroxine (SYNTHROID) 25 MCG tablet [Pharmacy Med Name: LEVOTHYROXINE 0.025MG  ( ) TAB] 30 tablet 0    Sig: TAKE 1 TABLET(25 MCG) BY MOUTH DAILY     Endocrinology:  Hypothyroid Agents Failed - 03/06/2023 11:04 AM      Failed - TSH in normal range and within 360 days    TSH  Date Value Ref Range Status  02/05/2023 4.930 (H) 0.350 - 4.500 uIU/mL Final    Comment:    Performed by a 3rd Generation assay with a functional sensitivity of <=0.01 uIU/mL. Performed at [facility], [address] [place], [place] [address]   11/03/2022 5.24 (H) 0.40 - 4.50 mIU/L Final         Passed - Valid encounter within last 12 months    Recent Outpatient Visits           3 weeks ago Chest tightness   [place] [facility] [place], Salvadore Oxford, NP   4 months ago Frequent headaches   [place] [facility] [place], Salvadore Oxford, NP       Future Appointments             In 2 months Baity, Salvadore Oxford, NP  [facility], [place]

## 2023-03-06 NOTE — Telephone Encounter (Signed)
Requested medications are due for refill today.  yes  Requested medications are on the active medications list.  yes  Last refill. 02/09/2023 #30 0 rf  Future visit scheduled.   yes  Notes to clinic.  Abnormal labs, please review for refill.    Requested Prescriptions  Pending Prescriptions Disp Refills   levothyroxine (SYNTHROID) 25 MCG tablet 30 tablet 0    Sig: Take 1 tablet (25 mcg total) by mouth daily.     Endocrinology:  Hypothyroid Agents Failed - 03/05/2023  4:18 PM      Failed - TSH in normal range and within 360 days    TSH  Date Value Ref Range Status  02/05/2023 4.930 (H) 0.350 - 4.500 uIU/mL Final    Comment:    Performed by a 3rd Generation assay with a functional sensitivity of <=0.01 uIU/mL. Performed at [facility], [address] [place], [place] [address]   11/03/2022 5.24 (H) 0.40 - 4.50 mIU/L Final         Passed - Valid encounter within last 12 months    Recent Outpatient Visits           3 weeks ago Chest tightness   [facility] [place], Salvadore Oxford, NP   4 months ago Frequent headaches   [place] [facility] [place], Salvadore Oxford, NP       Future Appointments             In 2 months Baity, Salvadore Oxford, NP [facility], [place]

## 2023-05-07 ENCOUNTER — Encounter: Payer: BC Managed Care – PPO | Admitting: Internal Medicine

## 2023-05-10 ENCOUNTER — Other Ambulatory Visit: Payer: Self-pay | Admitting: Internal Medicine

## 2023-05-12 NOTE — Telephone Encounter (Signed)
Requested Prescriptions  Pending Prescriptions Disp Refills   rosuvastatin (CRESTOR) 10 MG tablet [Pharmacy Med Name: ROSUVASTATIN 10MG  TABLETS] 36 tablet 1    Sig: TAKE 1 TABLET(10 MG) BY MOUTH 3 TIMES A WEEK     Cardiovascular:  Antilipid - Statins 2 Failed - 05/10/2023 11:27 AM      Failed - Lipid Panel in normal range within the last 12 months    Cholesterol  Date Value Ref Range Status  12/11/2022 202 (H) <200 mg/dL Final   LDL Cholesterol (Calc)  Date Value Ref Range Status  12/11/2022 134 (H) mg/dL (calc) Final    Comment:    Reference range: <100 . Desirable range <100 mg/dL for primary prevention;   <70 mg/dL for patients with CHD or diabetic patients  with > or = 2 CHD risk factors. Marland Kitchen LDL-C is now calculated using the Martin-Hopkins  calculation, which is a validated novel method providing  better accuracy than the Friedewald equation in the  estimation of LDL-C.  Horald Pollen et al. Lenox Ahr. 4166;063(01): 2061-2068  (http://education.[employer].com/faq/FAQ164)    HDL  Date Value Ref Range Status  12/11/2022 46 (L) > OR = 50 mg/dL Final   Triglycerides  Date Value Ref Range Status  12/11/2022 114 <150 mg/dL Final         Passed - Cr in normal range and within 360 days    Creat  Date Value Ref Range Status  12/11/2022 0.78 0.50 - 1.03 mg/dL Final   Creatinine, Ser  Date Value Ref Range Status  02/05/2023 0.82 0.44 - 1.00 mg/dL Final         Passed - Patient is not pregnant      Passed - Valid encounter within last 12 months    Recent Outpatient Visits           3 months ago Chest tightness   [place] [facility] [place], Salvadore Oxford, NP   6 months ago Frequent headaches   [place] [facility] [place], Salvadore Oxford, NP       Future Appointments             In 2 weeks Sampson Si, Salvadore Oxford, NP [place] [facility], [facility]

## 2023-05-28 ENCOUNTER — Encounter: Payer: Self-pay | Admitting: Internal Medicine

## 2023-05-28 ENCOUNTER — Ambulatory Visit (INDEPENDENT_AMBULATORY_CARE_PROVIDER_SITE_OTHER): Payer: 59 | Admitting: Internal Medicine

## 2023-05-28 VITALS — BP 132/82 | Ht 70.0 in | Wt 333.0 lb

## 2023-05-28 DIAGNOSIS — R7303 Prediabetes: Secondary | ICD-10-CM | POA: Diagnosis not present

## 2023-05-28 DIAGNOSIS — E66813 Obesity, class 3: Secondary | ICD-10-CM

## 2023-05-28 DIAGNOSIS — Z0001 Encounter for general adult medical examination with abnormal findings: Secondary | ICD-10-CM

## 2023-05-28 DIAGNOSIS — E039 Hypothyroidism, unspecified: Secondary | ICD-10-CM

## 2023-05-28 DIAGNOSIS — Z6841 Body Mass Index (BMI) 40.0 and over, adult: Secondary | ICD-10-CM

## 2023-05-28 DIAGNOSIS — E782 Mixed hyperlipidemia: Secondary | ICD-10-CM

## 2023-05-28 NOTE — Assessment & Plan Note (Signed)
Encouraged diet and exercise for weight loss ?

## 2023-05-28 NOTE — Patient Instructions (Signed)
Health Maintenance for Postmenopausal Women Menopause is a normal process in which your ability to get pregnant comes to an end. This process happens slowly over many months or years, usually between the ages of 48 and 55. Menopause is complete when you have missed your menstrual period for 12 months. It is important to talk with your health care provider about some of the most common conditions that affect women after menopause (postmenopausal women). These include heart disease, cancer, and bone loss (osteoporosis). Adopting a healthy lifestyle and getting preventive care can help to promote your health and wellness. The actions you take can also lower your chances of developing some of these common conditions. What are the signs and symptoms of menopause? During menopause, you may have the following symptoms: Hot flashes. These can be moderate or severe. Night sweats. Decrease in sex drive. Mood swings. Headaches. Tiredness (fatigue). Irritability. Memory problems. Problems falling asleep or staying asleep. Talk with your health care provider about treatment options for your symptoms. Do I need hormone replacement therapy? Hormone replacement therapy is effective in treating symptoms that are caused by menopause, such as hot flashes and night sweats. Hormone replacement carries certain risks, especially as you become older. If you are thinking about using estrogen or estrogen with progestin, discuss the benefits and risks with your health care provider. How can I reduce my risk for heart disease and stroke? The risk of heart disease, heart attack, and stroke increases as you age. One of the causes may be a change in the body's hormones during menopause. This can affect how your body uses dietary fats, triglycerides, and cholesterol. Heart attack and stroke are medical emergencies. There are many things that you can do to help prevent heart disease and stroke. Watch your blood pressure High  blood pressure causes heart disease and increases the risk of stroke. This is more likely to develop in people who have high blood pressure readings or are overweight. Have your blood pressure checked: Every 3-5 years if you are 18-39 years of age. Every year if you are 40 years old or older. Eat a healthy diet  Eat a diet that includes plenty of vegetables, fruits, low-fat dairy products, and lean protein. Do not eat a lot of foods that are high in solid fats, added sugars, or sodium. Get regular exercise Get regular exercise. This is one of the most important things you can do for your health. Most adults should: Try to exercise for at least 150 minutes each week. The exercise should increase your heart rate and make you sweat (moderate-intensity exercise). Try to do strengthening exercises at least twice each week. Do these in addition to the moderate-intensity exercise. Spend less time sitting. Even light physical activity can be beneficial. Other tips Work with your health care provider to achieve or maintain a healthy weight. Do not use any products that contain nicotine or tobacco. These products include cigarettes, chewing tobacco, and vaping devices, such as e-cigarettes. If you need help quitting, ask your health care provider. Know your numbers. Ask your health care provider to check your cholesterol and your blood sugar (glucose). Continue to have your blood tested as directed by your health care provider. Do I need screening for cancer? Depending on your health history and family history, you may need to have cancer screenings at different stages of your life. This may include screening for: Breast cancer. Cervical cancer. Lung cancer. Colorectal cancer. What is my risk for osteoporosis? After menopause, you may be   at increased risk for osteoporosis. Osteoporosis is a condition in which bone destruction happens more quickly than new bone creation. To help prevent osteoporosis or  the bone fractures that can happen because of osteoporosis, you may take the following actions: If you are 19-50 years old, get at least 1,000 mg of calcium and at least 600 international units (IU) of vitamin D per day. If you are older than age 50 but younger than age 70, get at least 1,200 mg of calcium and at least 600 international units (IU) of vitamin D per day. If you are older than age 70, get at least 1,200 mg of calcium and at least 800 international units (IU) of vitamin D per day. Smoking and drinking excessive alcohol increase the risk of osteoporosis. Eat foods that are rich in calcium and vitamin D, and do weight-bearing exercises several times each week as directed by your health care provider. How does menopause affect my mental health? Depression may occur at any age, but it is more common as you become older. Common symptoms of depression include: Feeling depressed. Changes in sleep patterns. Changes in appetite or eating patterns. Feeling an overall lack of motivation or enjoyment of activities that you previously enjoyed. Frequent crying spells. Talk with your health care provider if you think that you are experiencing any of these symptoms. General instructions See your health care provider for regular wellness exams and vaccines. This may include: Scheduling regular health, dental, and eye exams. Getting and maintaining your vaccines. These include: Influenza vaccine. Get this vaccine each year before the flu season begins. Pneumonia vaccine. Shingles vaccine. Tetanus, diphtheria, and pertussis (Tdap) booster vaccine. Your health care provider may also recommend other immunizations. Tell your health care provider if you have ever been abused or do not feel safe at home. Summary Menopause is a normal process in which your ability to get pregnant comes to an end. This condition causes hot flashes, night sweats, decreased interest in sex, mood swings, headaches, or lack  of sleep. Treatment for this condition may include hormone replacement therapy. Take actions to keep yourself healthy, including exercising regularly, eating a healthy diet, watching your weight, and checking your blood pressure and blood sugar levels. Get screened for cancer and depression. Make sure that you are up to date with all your vaccines. This information is not intended to replace advice given to you by your health care provider. Make sure you discuss any questions you have with your health care provider. Document Revised: 11/12/2020 Document Reviewed: 11/12/2020 Elsevier Patient Education  2024 Elsevier Inc.  

## 2023-05-28 NOTE — Progress Notes (Signed)
Subjective:    Patient ID: Debra Gonzalez, female    DOB: 1965-09-30, 57 y.o.   MRN: [id number]  HPI  Patient presents to clinic today for her annual exam.  She reports that she stopped taking her rosuvastatin secondary to joint pain.  Flu: never Tetanus: unsure COVID: X 2 Shingrix: never Pap smear: 06/2020 Mammogram: 06/2020, [facility] Bone density: never Colon screening: never Vision screening: as needed Dentist: biannually  Diet: She does eat meat. She does eat fruits and veggies. She does eat some fried foods. She drinks mostly water. Exercise: 2-3 times daily, stretching  Review of Systems   Past Medical History:  Diagnosis Date   Asthma    Hyperlipidemia    Reactive airway disease     Current Outpatient Medications  Medication Sig Dispense Refill   acetaminophen (TYLENOL) 500 MG tablet Take 500 mg by mouth every 6 (six) hours as needed.     albuterol (PROVENTIL) (2.5 MG/3ML) 0.083% nebulizer solution Take 3 mLs (2.5 mg total) by nebulization every 6 (six) hours as needed for wheezing or shortness of breath. 150 mL 1   albuterol (VENTOLIN HFA) 108 (90 Base) MCG/ACT inhaler Inhale 2 puffs into the lungs every 6 (six) hours as needed for wheezing or shortness of breath. 18 g 0   b complex vitamins tablet Take 1 tablet by mouth daily.     Biotin 1000 MCG tablet Take 1,000 mcg by mouth 3 (three) times daily.     cetirizine (ZYRTEC) 10 MG tablet Take 10 mg by mouth daily.     Cholecalciferol (VITAMIN D-3) 5000 UNIT/ML LIQD Place under the tongue.     fluticasone (FLONASE) 50 MCG/ACT nasal spray Place 2 sprays into both nostrils daily.     fluticasone-salmeterol (ADVAIR HFA) 230-21 MCG/ACT inhaler Inhale 2 puffs into the lungs 2 (two) times daily. 1 each 12   ibuprofen (ADVIL) 200 MG tablet Take 200 mg by mouth every 6 (six) hours as needed.     levothyroxine (SYNTHROID) 25 MCG tablet Take 1 tablet (25 mcg total) by mouth daily. 90 tablet 0   Magnesium 100 MG  TABS Take by mouth.     Multiple Vitamin (MULTIVITAMIN) tablet Take 1 tablet by mouth daily.     ondansetron (ZOFRAN-ODT) 4 MG disintegrating tablet Take 1 tablet (4 mg total) by mouth every 8 (eight) hours as needed for nausea or vomiting. 30 tablet 0   promethazine (PHENERGAN) 25 MG tablet Take 1 tablet (25 mg total) by mouth every 6 (six) hours as needed for nausea or vomiting. 10 tablet 0   rosuvastatin (CRESTOR) 10 MG tablet TAKE 1 TABLET(10 MG) BY MOUTH 3 TIMES A WEEK 36 tablet 1   Current Facility-Administered Medications  Medication Dose Route Frequency Provider Last Rate Last Admin   0.9 %  sodium chloride infusion   Intravenous PRN Ratcliffe, Heather R, PA-C        Allergies  Allergen Reactions   Sulfa Antibiotics Nausea And Vomiting   Cinnamon     "eating too much causes lip swelling"   Codeine Nausea Only   Corn-Containing Products Other (See Comments)    Throat swelling   Spinach Swelling    If 'eats too much gets lip swelling'   Wheat     "eating too much causes lip swelling"    Family History  Problem Relation Age of Onset   Mitral valve prolapse Mother    Depression Mother    Hyperlipidemia Mother    Hypertension  Mother    Melanoma Father    Hyperlipidemia Maternal Grandmother    Hypertension Maternal Grandmother    Diabetes Maternal Grandfather    Hyperlipidemia Paternal Grandmother    Stroke Paternal Grandmother    Bone cancer Paternal Grandfather     Social History   Socioeconomic History   Marital status: Divorced    Spouse name: Not on file   Number of children: Not on file   Years of education: Not on file   Highest education level: Not on file  Occupational History   Not on file  Tobacco Use   Smoking status: Never   Smokeless tobacco: Never  Vaping Use   Vaping status: Never Used  Substance and Sexual Activity   Alcohol use: Yes    Alcohol/week: 1.0 standard drink of alcohol    Types: 1 Glasses of wine per week    Comment: weekly    Drug use: No   Sexual activity: Not on file  Other Topics Concern   Not on file  Social History Narrative   Not on file   Social Determinants of Health   Financial Resource Strain: Not on file  Food Insecurity: Not on file  Transportation Needs: Not on file  Physical Activity: Not on file  Stress: Not on file  Social Connections: Not on file  Intimate Partner Violence: Not on file     Constitutional: Patient reports intermittent headaches.  Denies fever, malaise, fatigue, or abrupt weight changes.  HEENT: Denies eye pain, eye redness, ear pain, ringing in the ears, wax buildup, runny nose, nasal congestion, bloody nose, or sore throat. Respiratory: Denies difficulty breathing, shortness of breath, cough or sputum production.   Cardiovascular: Denies chest pain, chest tightness, palpitations or swelling in the hands or feet.  Gastrointestinal: Denies abdominal pain, bloating, constipation, diarrhea or blood in the stool.  GU: Denies urgency, frequency, pain with urination, burning sensation, blood in urine, odor or discharge. Musculoskeletal: Denies decrease in range of motion, difficulty with gait, muscle pain or joint pain and swelling.  Skin: Denies redness, rashes, lesions or ulcercations.  Neurological: Denies dizziness, difficulty with memory, difficulty with speech or problems with balance and coordination.  Psych: Denies anxiety, depression, SI/HI.  No other specific complaints in a complete review of systems (except as listed in HPI above).      Objective:   Physical Exam  BP 132/82   Ht 5\' 10"  (1.778 m)   Wt (!) 333 lb (151 kg)   LMP  (LMP Unknown)   BMI 47.78 kg/m   Wt Readings from Last 3 Encounters:  02/05/23 (!) 320 lb (145.2 kg)  11/03/22 (!) 335 lb (152 kg)  01/08/21 (!) 324 lb 6.4 oz (147.1 kg)    General: Appears her stated age, obese, in NAD. Skin: Warm, dry and intact. No ulcerations noted. HEENT: Head: normal shape and size; Eyes: sclera white,  no icterus, conjunctiva pink, PERRLA and EOMs intact;  Neck:  Neck supple, trachea midline. No masses, lumps or thyromegaly present.  Cardiovascular: Normal rate and rhythm. S1,S2 noted.  No murmur, rubs or gallops noted. No JVD.  Trace nonpitting BLE edema. No carotid bruits noted. Pulmonary/Chest: Normal effort and positive vesicular breath sounds. No respiratory distress. No wheezes, rales or ronchi noted.  Abdomen: Soft and nontender. Normal bowel sounds. No distention or masses noted. Liver, spleen and kidneys non palpable. Musculoskeletal: Strength 5/5 BUE/BLE. No difficulty with gait.  Neurological: Alert and oriented. Cranial nerves II-XII grossly intact. Coordination normal.  Psychiatric: Mood and affect normal. Behavior is normal. Judgment and thought content normal.    BMET    Component Value Date/Time   NA 138 02/05/2023 1241   K 4.4 02/05/2023 1241   CL 104 02/05/2023 1241   CO2 25 02/05/2023 1241   GLUCOSE 111 (H) 02/05/2023 1241   BUN 14 02/05/2023 1241   CREATININE 0.82 02/05/2023 1241   CREATININE 0.78 12/11/2022 0845   CALCIUM 9.1 02/05/2023 1241   GFRNONAA >60 02/05/2023 1241   GFRAA >60 11/24/2019 2056    Lipid Panel     Component Value Date/Time   CHOL 202 (H) 12/11/2022 0845   TRIG 114 12/11/2022 0845   HDL 46 (L) 12/11/2022 0845   CHOLHDL 4.4 12/11/2022 0845   LDLCALC 134 (H) 12/11/2022 0845    CBC    Component Value Date/Time   WBC 7.0 02/05/2023 1241   RBC 4.57 02/05/2023 1241   HGB 14.6 02/05/2023 1241   HCT 42.5 02/05/2023 1241   PLT 268 02/05/2023 1241   MCV 93.0 02/05/2023 1241   MCH 31.9 02/05/2023 1241   MCHC 34.4 02/05/2023 1241   RDW 12.4 02/05/2023 1241    Hgb A1C Lab Results  Component Value Date   HGBA1C 5.9 (H) 11/03/2022            Assessment & Plan:   Preventative health maintenance:  Flu shot declined Tetanus declined Encouraged her to get her COVID booster Discussed Shingrix vaccine, she will check coverage  with her insurance company and schedule visit if she would like to have this done Pap smear UTD She will call GYN to schedule her mammogram and bone density She did plans Cologuard or referral to GI for screening colonoscopy Encouraged her to consume a balanced diet and exercise regimen Advised her to see an eye doctor and dentist annually We will check CBC, c-Met, TSH, free T4, lipid, A1c today  RTC in 6 months, follow-up chronic conditions Nicki Reaper, NP

## 2023-05-29 ENCOUNTER — Ambulatory Visit: Payer: Self-pay

## 2023-05-29 ENCOUNTER — Other Ambulatory Visit: Payer: Self-pay

## 2023-05-29 LAB — CBC
HCT: 43.5 % (ref 35.0–45.0)
Hemoglobin: 14.5 g/dL (ref 11.7–15.5)
MCH: 32.1 pg (ref 27.0–33.0)
MCHC: 33.3 g/dL (ref 32.0–36.0)
MCV: 96.2 fL (ref 80.0–100.0)
MPV: 11.8 fL (ref 7.5–12.5)
Platelets: 258 10*3/uL (ref 140–400)
RBC: 4.52 10*6/uL (ref 3.80–5.10)
RDW: 12 % (ref 11.0–15.0)
WBC: 5.5 10*3/uL (ref 3.8–10.8)

## 2023-05-29 LAB — LIPID PANEL
Cholesterol: 278 mg/dL — ABNORMAL HIGH (ref <200)
HDL: 46 mg/dL — ABNORMAL LOW (ref >=50)
LDL Cholesterol (Calc): 203 mg/dL — ABNORMAL HIGH
Non-HDL Cholesterol (Calc): 232 mg/dL — ABNORMAL HIGH (ref <130)
Total CHOL/HDL Ratio: 6 (calc) — ABNORMAL HIGH (ref <5.0)
Triglycerides: 138 mg/dL (ref <150)

## 2023-05-29 LAB — HEMOGLOBIN A1C
Hgb A1c MFr Bld: 5.9 %{Hb} — ABNORMAL HIGH (ref <5.7)
Mean Plasma Glucose: 123 mg/dL
eAG (mmol/L): 6.8 mmol/L

## 2023-05-29 LAB — COMPLETE METABOLIC PANEL WITH GFR
AG Ratio: 1.5 (calc) (ref 1.0–2.5)
ALT: 18 U/L (ref 6–29)
AST: 14 U/L (ref 10–35)
Albumin: 4 g/dL (ref 3.6–5.1)
Alkaline phosphatase (APISO): 67 U/L (ref 37–153)
BUN: 12 mg/dL (ref 7–25)
CO2: 24 mmol/L (ref 20–32)
Calcium: 9 mg/dL (ref 8.6–10.4)
Chloride: 106 mmol/L (ref 98–110)
Creat: 0.78 mg/dL (ref 0.50–1.03)
Globulin: 2.6 g/dL (ref 1.9–3.7)
Glucose, Bld: 128 mg/dL — ABNORMAL HIGH (ref 65–99)
Potassium: 4.2 mmol/L (ref 3.5–5.3)
Sodium: 140 mmol/L (ref 135–146)
Total Bilirubin: 0.6 mg/dL (ref 0.2–1.2)
Total Protein: 6.6 g/dL (ref 6.1–8.1)
eGFR: 88 mL/min/{1.73_m2} (ref >=60)

## 2023-05-29 LAB — T4, FREE: Free T4: 1.3 ng/dL (ref 0.8–1.8)

## 2023-05-29 LAB — TSH: TSH: 3.85 m[IU]/L (ref 0.40–4.50)

## 2023-05-29 MED ORDER — LEVOTHYROXINE SODIUM 25 MCG PO TABS: 25.0000 ug | ORAL_TABLET | Freq: Every day | ORAL | 1 refills | Status: AC

## 2023-05-29 NOTE — Telephone Encounter (Signed)
Requested Prescriptions  Pending Prescriptions Disp Refills   levothyroxine (SYNTHROID) 25 MCG tablet 90 tablet 1    Sig: Take 1 tablet (25 mcg total) by mouth daily.     Endocrinology:  Hypothyroid Agents Passed - 05/29/2023 11:00 AM      Passed - TSH in normal range and within 360 days    TSH  Date Value Ref Range Status  05/28/2023 3.85 0.40 - 4.50 mIU/L Final         Passed - Valid encounter within last 12 months    Recent Outpatient Visits           Yesterday Encounter for general adult medical examination with abnormal findings   [facility] [place], Salvadore Oxford, NP   3 months ago Chest tightness   [facility] [place], Salvadore Oxford, NP   6 months ago Frequent headaches    [facility] [address], Salvadore Oxford, NP       Future Appointments             In 6 months Baity, Salvadore Oxford, NP  [facility], [facility]

## 2023-05-29 NOTE — Telephone Encounter (Signed)
Propranolol treats migraines, palpitations, tremors and blood pressure.  It does not treat cholesterol.  She is not taking any cholesterol-lowering medication at this time, was on rosuvastatin and atorvastatin in the past but she stopped these medications.  I cannot remember why she stopped them but I do think that she needs something for cholesterol.  If she does not want to try a different statin, she can try red yeast rice OTC although the effectiveness of this is truly unknown.

## 2023-05-29 NOTE — Telephone Encounter (Signed)
  Chief Complaint: medication assistance  Symptoms: NA Frequency:  Pertinent Negatives: NA Disposition: [] ED /[] Urgent Care (no appt availability in office) / [] Appointment(In office/virtual)/ []  [facility]/ [] Home Care/ [] Refused Recommended Disposition /[] [place] [facility]/ [x]  Follow-up with PCP Additional Notes: pt states she has discussed with Debra Kocher, NP in past OV about propanolol helping with migraine and cholesterol. Pt seen lab results message about needing cholesterol meds and was unsure which she needed to prescribe. Pt would like to discuss with Debra Gonzalez if possible. Advised I would send message back and have nurse FU with her. Pt verbalized understanding.   Summary: Talk to provider about medication   The patient called in stating she was having symptoms from the Rosuvastatin and even had an ER visit recently. She would like to speak with her provider about trying a medication they previously talked about whether it was Propanalol or [place]. Please assist patient further         Reason for Disposition  [1] Caller has URGENT medicine question about med that PCP or specialist prescribed AND [2] triager unable to answer question  Answer Assessment - Initial Assessment Questions 1. NAME of MEDICINE: "What medicine(s) are you calling about?"     Propanolol  2. QUESTION: "What is your question?" (e.g., double dose of medicine, side effect)     Wanting to discuss for headaches and cholesterol, had discussed in previous OV  3. PRESCRIBER: "Who prescribed the medicine?" Reason: if prescribed by specialist, call should be referred to that group.     Debra Kocher, NP  Protocols used: Medication Question Call-A-AH

## 2023-07-21 ENCOUNTER — Ambulatory Visit: Payer: 59 | Admitting: Internal Medicine

## 2023-07-21 ENCOUNTER — Encounter: Payer: Self-pay | Admitting: Internal Medicine

## 2023-07-21 VITALS — BP 134/82 | Ht 70.0 in | Wt 336.0 lb

## 2023-07-21 DIAGNOSIS — G43019 Migraine without aura, intractable, without status migrainosus: Secondary | ICD-10-CM

## 2023-07-21 MED ORDER — RIZATRIPTAN BENZOATE 10 MG PO TABS: 10.0000 mg | ORAL_TABLET | ORAL | 5 refills | Status: AC | PRN

## 2023-07-21 NOTE — Patient Instructions (Signed)
 Migraine Headache A migraine headache is a very strong throbbing pain on one or both sides of your head. This type of headache can also cause other symptoms. It can last from 4 hours to 3 days. Talk with your doctor about what things may bring on (trigger) this condition. What are the causes? The exact cause of a migraine is not known. This condition may be brought on or caused by: Smoking. Medicines, such as: Medicine used to treat chest pain (nitroglycerin). Birth control pills. Estrogen. Some blood pressure medicines. Certain substances in some foods or drinks. Foods and drinks, such as: Cheese. Chocolate. Alcohol. Caffeine. Doing physical activity that is very hard. Other things that may trigger a migraine headache include: Periods. Pregnancy. Hunger. Stress. Getting too much or too little sleep. Weather changes. Feeling tired (fatigue). What increases the risk? Being 18-65 years old. Being female. Having a family history of migraine headaches. Being Caucasian. Having a mental health condition, such as being sad (depressed) or feeling worried or nervous (anxious). Being very overweight (obese). What are the signs or symptoms? A throbbing pain. This pain may: Happen in any area of the head, such as on one or both sides. Make it hard to do daily activities. Get worse with physical activity. Get worse around bright lights, loud noises, or smells. Other symptoms may include: Feeling like you may vomit (nauseous). Vomiting. Dizziness. Before a migraine headache starts, you may get warning signs (an aura). An aura may include: Seeing flashing lights or having blind spots. Seeing bright spots, halos, or zigzag lines. Having tunnel vision or blurred vision. Having numbness or a tingling feeling. Having trouble talking. Having weak muscles. After a migraine ends, you may have symptoms. These may include: Tiredness. Trouble thinking (concentrating). How is this  treated? Taking medicines that: Relieve pain. Relieve the feeling like you may vomit. Prevent migraine headaches. Treatment may also include: Acupuncture. Lifestyle changes like avoiding foods that bring on migraine headaches. Learning ways to control your body functions (biofeedback). Therapy to help you know and deal with negative thoughts (cognitive behavioral therapy). Follow these instructions at home: Medicines Take over-the-counter and prescription medicines only as told by your doctor. If told, take steps to prevent problems with pooping (constipation). You may need to: Drink enough fluid to keep your pee (urine) pale yellow. Take medicines. You will be told what medicines to take. Eat foods that are high in fiber. These include beans, whole grains, and fresh fruits and vegetables. Limit foods that are high in fat and sugar. These include fried or sweet foods. Ask your doctor if you should avoid driving or using machines while you are taking your medicine. Lifestyle  Do not drink alcohol. Do not smoke or use any products that contain nicotine or tobacco. If you need help quitting, ask your doctor. Get 7-9 hours of sleep each night, or the amount recommended by your doctor. Find ways to deal with stress, such as meditation, deep breathing, or yoga. Try to exercise often. This can help lessen how bad and how often your migraines happen. General instructions Keep a journal to find out what may bring on your migraine headaches. This can help you avoid those things. For example, write down: What you eat and drink. How much sleep you get. Any change to your medicines or diet. If you have a migraine headache: Avoid things that make your symptoms worse, such as bright lights. Lie down in a dark, quiet room. Do not drive or use machinery. Ask your  doctor what activities are safe for you. Where to find more information Coalition for Headache and Migraine Patients (CHAMP):  headachemigraine.org American Migraine Foundation: americanmigrainefoundation.org National Headache Foundation: headaches.org Contact a doctor if: You get a migraine headache that is different or worse than others you have had. You have more than 15 days of headaches in one month. Get help right away if: Your migraine headache gets very bad. Your migraine headache lasts more than 72 hours. You have a fever or stiff neck. You have trouble seeing. Your muscles feel weak or like you cannot control them. You lose your balance a lot. You have trouble walking. You faint. You have a seizure. This information is not intended to replace advice given to you by your health care provider. Make sure you discuss any questions you have with your health care provider. Document Revised: 02/17/2022 Document Reviewed: 02/17/2022 Elsevier Patient Education  2024 [employer].

## 2023-07-21 NOTE — Progress Notes (Signed)
 Subjective:    Patient ID: Debra Gonzalez, female    DOB: Jun 06, 1966, 58 y.o.   MRN: [id number]  HPI  Discussed the use of AI scribe software for clinical note transcription with the patient, who gave verbal consent to proceed.  The patient, with a history of migraines, presents with a two-day history of a severe headache. The headache, described as 'front heavy,' started centrally and migrated anteriorly. It is bilateral and associated with nausea, which was partially relieved by eating peanut butter toast. The patient also reported dizziness on the first day of the headache. Over-the-counter ibuprofen was taken for symptomatic relief.  The patient has a history of using Imitrex for migraines, but discontinued it when it seemed to lose efficacy. She reported having three migraines in the past month, each lasting two days. The patient suspects stress as a potential trigger for these migraines.  The patient also mentioned scheduling an eye appointment, suspecting that her vision might be contributing to her headaches. She has not had an eye examination within the past year.       Review of Systems   Past Medical History:  Diagnosis Date   Asthma    Hyperlipidemia    Reactive airway disease     Current Outpatient Medications  Medication Sig Dispense Refill   acetaminophen (TYLENOL) 500 MG tablet Take 500 mg by mouth every 6 (six) hours as needed.     albuterol  (PROVENTIL ) (2.5 MG/3ML) 0.083% nebulizer solution Take 3 mLs (2.5 mg total) by nebulization every 6 (six) hours as needed for wheezing or shortness of breath. 150 mL 1   albuterol  (VENTOLIN  HFA) 108 (90 Base) MCG/ACT inhaler Inhale 2 puffs into the lungs every 6 (six) hours as needed for wheezing or shortness of breath. 18 g 0   b complex vitamins tablet Take 1 tablet by mouth daily.     Biotin 1000 MCG tablet Take 1,000 mcg by mouth 3 (three) times daily.     cetirizine (ZYRTEC) 10 MG tablet Take 10 mg by mouth daily.      Cholecalciferol (VITAMIN D-3) 5000 UNIT/ML LIQD Place under the tongue.     fluticasone (FLONASE) 50 MCG/ACT nasal spray Place 2 sprays into both nostrils daily.     fluticasone-salmeterol (ADVAIR  HFA) 230-21 MCG/ACT inhaler Inhale 2 puffs into the lungs 2 (two) times daily. 1 each 12   ibuprofen (ADVIL) 200 MG tablet Take 200 mg by mouth every 6 (six) hours as needed.     levothyroxine  (SYNTHROID ) 25 MCG tablet Take 1 tablet (25 mcg total) by mouth daily. 90 tablet 1   Magnesium 100 MG TABS Take by mouth.     Multiple Vitamin (MULTIVITAMIN) tablet Take 1 tablet by mouth daily.     ondansetron  (ZOFRAN -ODT) 4 MG disintegrating tablet Take 1 tablet (4 mg total) by mouth every 8 (eight) hours as needed for nausea or vomiting. 30 tablet 0   promethazine  (PHENERGAN ) 25 MG tablet Take 1 tablet (25 mg total) by mouth every 6 (six) hours as needed for nausea or vomiting. 10 tablet 0   No current facility-administered medications for this visit.    Allergies  Allergen Reactions   Sulfa Antibiotics Nausea And Vomiting   Cinnamon     eating too much causes lip swelling   Codeine Nausea Only   Corn-Containing Products Other (See Comments)    Throat swelling   Spinach Swelling    If 'eats too much gets lip swelling'   Sulfacarbamide Other (See Comments)  Wheat     eating too much causes lip swelling    Family History  Problem Relation Age of Onset   Mitral valve prolapse Mother    Depression Mother    Hyperlipidemia Mother    Hypertension Mother    Melanoma Father    Hyperlipidemia Maternal Grandmother    Hypertension Maternal Grandmother    Diabetes Maternal Grandfather    Hyperlipidemia Paternal Grandmother    Stroke Paternal Grandmother    Bone cancer Paternal Grandfather     Social History   Socioeconomic History   Marital status: Divorced    Spouse name: Not on file   Number of children: Not on file   Years of education: Not on file   Highest education level: Not on  file  Occupational History   Not on file  Tobacco Use   Smoking status: Never   Smokeless tobacco: Never  Vaping Use   Vaping status: Never Used  Substance and Sexual Activity   Alcohol use: Yes    Alcohol/week: 1.0 standard drink of alcohol    Types: 1 Glasses of wine per week    Comment: weekly   Drug use: No   Sexual activity: Not on file  Other Topics Concern   Not on file  Social History Narrative   Not on file   Social Drivers of Health   Financial Resource Strain: Low Risk  (05/28/2023)   Overall Financial Resource Strain (CARDIA)    Difficulty of Paying Living Expenses: Not very hard  Food Insecurity: No Food Insecurity (05/28/2023)   Hunger Vital Sign    Worried About Running Out of Food in the Last Year: Never true    Ran Out of Food in the Last Year: Never true  Transportation Needs: No Transportation Needs (05/28/2023)   PRAPARE - Administrator, Civil Service (Medical): No    Lack of Transportation (Non-Medical): No  Physical Activity: Insufficiently Active (05/28/2023)   Exercise Vital Sign    Days of Exercise per Week: 3 days    Minutes of Exercise per Session: 20 min  Stress: No Stress Concern Present (05/28/2023)   [employer] of Occupational Health - Occupational Stress Questionnaire    Feeling of Stress : Only a little  Social Connections: Moderately Integrated (05/28/2023)   Social Connection and Isolation Panel [NHANES]    Frequency of Communication with Friends and Family: Twice a week    Frequency of Social Gatherings with Friends and Family: Once a week    Attends Religious Services: More than 4 times per year    Active Member of [employer] or Organizations: Yes    Attends Banker Meetings: More than 4 times per year    Marital Status: Divorced  Intimate Partner Violence: Not At Risk (05/28/2023)   Humiliation, Afraid, Rape, and Kick questionnaire    Fear of Current or Ex-Partner: No    Emotionally Abused: No     Physically Abused: No    Sexually Abused: No     Constitutional: Pt reports headache. Denies fever, malaise, fatigue, or abrupt weight changes.  HEENT: Denies eye pain, eye redness, ear pain, ringing in the ears, wax buildup, runny nose, nasal congestion, bloody nose, or sore throat. Respiratory: Denies difficulty breathing, shortness of breath, cough or sputum production.   Cardiovascular: Denies chest pain, chest tightness, palpitations or swelling in the hands or feet.  Gastrointestinal: Pt reports nausea. Denies abdominal pain, bloating, constipation, diarrhea or blood in the stool.  GU: Denies  urgency, frequency, pain with urination, burning sensation, blood in urine, odor or discharge. Musculoskeletal: Denies decrease in range of motion, difficulty with gait, muscle pain or joint pain and swelling.  Skin: Denies redness, rashes, lesions or ulcercations.  Neurological: Pt reports intermittent dizziness. Denies difficulty with memory, difficulty with speech or problems with balance and coordination.  Psych: Denies anxiety, depression, SI/HI.  No other specific complaints in a complete review of systems (except as listed in HPI above).      Objective:   Physical Exam  BP 134/82 (BP Location: Left Arm, Patient Position: Sitting, Cuff Size: Large)   Ht 5' 10 (1.778 m)   Wt (!) 336 lb (152.4 kg)   LMP  (LMP Unknown)   BMI 48.21 kg/m   Wt Readings from Last 3 Encounters:  05/28/23 (!) 333 lb (151 kg)  02/05/23 (!) 320 lb (145.2 kg)  11/03/22 (!) 335 lb (152 kg)    General: Appears her stated age, obese, in NAD. Skin: Warm, dry and intact.  HEENT: Head: normal shape and size; Eyes: sclera white, PERRLA and EOMs intact;   Cardiovascular: Normal rate. Pulmonary/Chest: Normal effort. Neurological: Alert and oriented.  Coordination normal.    BMET    Component Value Date/Time   NA 140 05/28/2023 1324   K 4.2 05/28/2023 1324   CL 106 05/28/2023 1324   CO2 24 05/28/2023  1324   GLUCOSE 128 (H) 05/28/2023 1324   BUN 12 05/28/2023 1324   CREATININE 0.78 05/28/2023 1324   CALCIUM  9.0 05/28/2023 1324   GFRNONAA >60 02/05/2023 1241   GFRAA >60 11/24/2019 2056    Lipid Panel     Component Value Date/Time   CHOL 278 (H) 05/28/2023 1324   TRIG 138 05/28/2023 1324   HDL 46 (L) 05/28/2023 1324   CHOLHDL 6.0 (H) 05/28/2023 1324   LDLCALC 203 (H) 05/28/2023 1324    CBC    Component Value Date/Time   WBC 5.5 05/28/2023 1324   RBC 4.52 05/28/2023 1324   HGB 14.5 05/28/2023 1324   HCT 43.5 05/28/2023 1324   PLT 258 05/28/2023 1324   MCV 96.2 05/28/2023 1324   MCH 32.1 05/28/2023 1324   MCHC 33.3 05/28/2023 1324   RDW 12.0 05/28/2023 1324    Hgb A1C Lab Results  Component Value Date   HGBA1C 5.9 (H) 05/28/2023            Assessment & Plan:   Assessment and Plan    Migraine Current episode lasting two days with frontally located headache, nausea, and dizziness. Previous use of Imitrex with decreased efficacy over time. Three episodes in the last month. -Start Maxalt  10mg , initially half a tablet, increasing to full dose if ineffective. -Consider preventative therapy if frequency remains high in the next few months. -Encouraged to identify and manage triggers, particularly stress. -Scheduled eye appointment due to potential visual trigger for migraines.     RTC in 4 months for follow-up of chronic conditions Angeline Laura, NP

## 2023-09-15 ENCOUNTER — Ambulatory Visit: Payer: Self-pay | Admitting: Internal Medicine

## 2023-09-15 ENCOUNTER — Encounter: Payer: Self-pay | Admitting: Family Medicine

## 2023-09-15 ENCOUNTER — Ambulatory Visit: Payer: Self-pay | Admitting: Family Medicine

## 2023-09-15 VITALS — BP 136/70 | HR 85 | Temp 98.6°F | Resp 16 | Ht 70.0 in | Wt 320.2 lb

## 2023-09-15 DIAGNOSIS — R229 Localized swelling, mass and lump, unspecified: Secondary | ICD-10-CM | POA: Insufficient documentation

## 2023-09-15 DIAGNOSIS — R21 Rash and other nonspecific skin eruption: Secondary | ICD-10-CM | POA: Insufficient documentation

## 2023-09-15 DIAGNOSIS — F411 Generalized anxiety disorder: Secondary | ICD-10-CM | POA: Insufficient documentation

## 2023-09-15 DIAGNOSIS — R7303 Prediabetes: Secondary | ICD-10-CM

## 2023-09-15 DIAGNOSIS — E7800 Pure hypercholesterolemia, unspecified: Secondary | ICD-10-CM

## 2023-09-15 NOTE — Assessment & Plan Note (Signed)
 Non-tender subcutaneous lump, likely benign. Differential includes hematoma or benign lesion. Observation recommended. - Monitor for changes in size, pain, or if it spreads to other areas - Consider punch biopsy if changes occur. - Advise heat or ice and compression for comfort. - Educated on signs of complications requiring further evaluation.

## 2023-09-15 NOTE — Telephone Encounter (Signed)
 noted

## 2023-09-15 NOTE — Assessment & Plan Note (Signed)
 Improved with recent life changes. Non-pharmacological strategies encouraged. - Encourage non-pharmacological coping strategies. - Monitor for worsening symptoms.

## 2023-09-15 NOTE — Telephone Encounter (Signed)
  Chief Complaint: left thigh lump Symptoms: painless left thigh lump (about the size of a golf ball) Frequency: x 3 days. Pertinent Negatives: Patient denies pain, swelling, fever, itchiness, recent major surgery, recent prolonged travel, chest pain, SOB. Disposition: [] ED /[] Urgent Care (no appt availability in office) / [x] Appointment(In office/virtual)/ []  [facility]/ [] Home Care/ [] Refused Recommended Disposition /[] [facility]/ []  Follow-up with PCP Additional Notes: Patient states she has a knot just under the skin on her left upper thigh (about the size of a golf ball, hard knot). She denies any pain when pressing on it. She states it does sometimes appear red or warm. Patient scheduled for acute visit today with [facility]. Patient requesting to have Kennon Rounds as her new PCP, informed patient per the [facility], Kennon Rounds is not accepting new patients. Scheduled patient for transfer of care to Laju[facility] Matte.  Copied from CRM [id number]. Topic: Clinical - Red Word Triage >> Sep 15, 2023 10:32 AM Marlow Baars wrote: Red Word that prompted transfer to Nurse Triage: The patient called in stating she has noticed in her left upper thigh a knot. She states she does have high cholesterol so it is definitely concerning. I will transfer her to E2C2 NT Reason for Disposition  [1] Swelling is red AND [2] size > 2 inches (5.0 cm)  (Exception: Itchy area of skin.)  Answer Assessment - Initial Assessment Questions 1. APPEARANCE of SWELLING: "What does it look like?"     About the size of a golf ball just under the skin. She states toward the end of the day it might be a tinge of redness noted.  2. SIZE: "How large is the swelling?" (e.g., inches, cm; or compare to size of pinhead, tip of pen, eraser, coin, pea, grape, ping pong ball)      About the size of a golf ball.  3. LOCATION: "Where is the swelling located?"     Left upper thigh. 4. ONSET: "When did the swelling start?"     X 3  days.  5. COLOR: "What color is it?" "Is there more than one color?"     Skin colored, toward end of day notices some redness.  6. PAIN: "Is there any pain?" If Yes, ask: "How bad is the pain?" (e.g., scale 1-10; or mild, moderate, severe)     - NONE (0): no pain   - MILD (1-3): doesn't interfere with normal activities    - MODERATE (4-7): interferes with normal activities or awakens from sleep    - SEVERE (8-10): excruciating pain, unable to do any normal activities     Denies.  7. ITCH: "Does it itch?" If Yes, ask: "How bad is the itch?"      Denies.  8. CAUSE: "What do you think caused the swelling?"     Patient states she has been sitting a lot and states it was a sedentary job. She states she has high cholesterol, unsure if that could be causing it.  9 OTHER SYMPTOMS: "Do you have any other symptoms?" (e.g., fever)     Occasional warmth to the lump, states yesterday she felt flushed.  Protocols used: Skin Lump or Localized Swelling-A-AH

## 2023-09-15 NOTE — Progress Notes (Unsigned)
 Acute Office Visit  Subjective:    Patient ID: Debra Gonzalez, female    DOB: 02/11/1966, 58 y.o.   MRN: [id number]  Chief Complaint  Patient presents with  . Cyst    Discussed the use of AI scribe software for clinical note transcription with the patient, who gave verbal consent to proceed.   HPI: Patient is in today for lump she found on her left thigh. Patient denies any pain or redness. She stated she noticed it Sunday morning ,However she does not know exactly how long its been there.   The patient is a 58 year old with a history of varicose veins who presents with a new lump on her leg.  She discovered a new lump on her leg on Sunday morning while rubbing her legs. The lump is about the size of a golf ball, three-dimensional, and located under the skin. It is not painful or itchy, but seems worse at night. She has a history of varicose veins, which were previously treated with sclerotherapy and injections. Her grandmother died from a varicose vein issue, which adds to her concern.  She mentions another similar lesion on her leg that has been present for almost a year. This older lesion was initially purple, red, and inflamed but has improved over time without treatment. It is now soft and not associated with a palpable knot.  She has a history of high cholesterol and was previously prescribed rosuvastatin, which she could not tolerate due to side effects. She is currently trying to manage her cholesterol naturally with red rice yeast, omega-3, and CoQ10 supplements. She was previously noted to be prediabetic and is attempting to manage this through lifestyle changes.  She has experienced heart palpitations in the past, which she attributes to stress and anxiety. She recently quit her job and moved in with her mother, which she feels has positively impacted her stress levels and overall health, including a weight loss of ten pounds. No current anxiety medication use.  She is currently  taking levothyroxine for thyroid management and has a history of atypical chest pain, which was evaluated in the past. Her blood pressure has been high in the past but appears to be under better control recently. Past Medical History:  Diagnosis Date  . Asthma   . Hyperlipidemia   . Reactive airway disease     Past Surgical History:  Procedure Laterality Date  . CHOLECYSTECTOMY    . TONSILLECTOMY      Family History  Problem Relation Age of Onset  . Mitral valve prolapse Mother   . Depression Mother   . Hyperlipidemia Mother   . Hypertension Mother   . Melanoma Father   . Hyperlipidemia Maternal Grandmother   . Hypertension Maternal Grandmother   . Diabetes Maternal Grandfather   . Hyperlipidemia Paternal Grandmother   . Stroke Paternal Grandmother   . Bone cancer Paternal Grandfather     Social History   Socioeconomic History  . Marital status: Divorced    Spouse name: Not on file  . Number of children: Not on file  . Years of education: Not on file  . Highest education level: Not on file  Occupational History  . Not on file  Tobacco Use  . Smoking status: Never  . Smokeless tobacco: Never  Vaping Use  . Vaping status: Never Used  Substance and Sexual Activity  . Alcohol use: Yes    Alcohol/week: 1.0 standard drink of alcohol    Types: 1 Glasses of  wine per week    Comment: weekly  . Drug use: No  . Sexual activity: Not on file  Other Topics Concern  . Not on file  Social History Narrative  . Not on file   Social Drivers of Health   Financial Resource Strain: Low Risk  (05/28/2023)   Overall Financial Resource Strain (CARDIA)   . Difficulty of Paying Living Expenses: Not very hard  Food Insecurity: No Food Insecurity (05/28/2023)   Hunger Vital Sign   . Worried About Programme researcher, broadcasting/film/video in the Last Year: Never true   . Ran Out of Food in the Last Year: Never true  Transportation Needs: No Transportation Needs (05/28/2023)   PRAPARE - Transportation    . Lack of Transportation (Medical): No   . Lack of Transportation (Non-Medical): No  Physical Activity: Insufficiently Active (05/28/2023)   Exercise Vital Sign   . Days of Exercise per Week: 3 days   . Minutes of Exercise per Session: 20 min  Stress: No Stress Concern Present (05/28/2023)   [employer] of Occupational Health - Occupational Stress Questionnaire   . Feeling of Stress : Only a little  Social Connections: Moderately Integrated (05/28/2023)   Social Connection and Isolation Panel [NHANES]   . Frequency of Communication with Friends and Family: Twice a week   . Frequency of Social Gatherings with Friends and Family: Once a week   . Attends Religious Services: More than 4 times per year   . Active Member of Clubs or Organizations: Yes   . Attends Banker Meetings: More than 4 times per year   . Marital Status: Divorced  Catering manager Violence: Not At Risk (05/28/2023)   Humiliation, Afraid, Rape, and Kick questionnaire   . Fear of Current or Ex-Partner: No   . Emotionally Abused: No   . Physically Abused: No   . Sexually Abused: No    Outpatient Medications Prior to Visit  Medication Sig Dispense Refill  . acetaminophen (TYLENOL) 500 MG tablet Take 500 mg by mouth every 6 (six) hours as needed.    Marland Kitchen albuterol (PROVENTIL) (2.5 MG/3ML) 0.083% nebulizer solution Take 3 mLs (2.5 mg total) by nebulization every 6 (six) hours as needed for wheezing or shortness of breath. 150 mL 1  . albuterol (VENTOLIN HFA) 108 (90 Base) MCG/ACT inhaler Inhale 2 puffs into the lungs every 6 (six) hours as needed for wheezing or shortness of breath. 18 g 0  . b complex vitamins tablet Take 1 tablet by mouth daily.    . Biotin 1000 MCG tablet Take 1,000 mcg by mouth 3 (three) times daily.    . Cholecalciferol (VITAMIN D-3) 5000 UNIT/ML LIQD Place under the tongue.    . fluticasone (FLONASE) 50 MCG/ACT nasal spray Place 2 sprays into both nostrils daily.    Marland Kitchen ibuprofen  (ADVIL) 200 MG tablet Take 200 mg by mouth every 6 (six) hours as needed.    Marland Kitchen levothyroxine (SYNTHROID) 25 MCG tablet Take 1 tablet (25 mcg total) by mouth daily. 90 tablet 1  . Magnesium 100 MG TABS Take by mouth.    . Multiple Vitamin (MULTIVITAMIN) tablet Take 1 tablet by mouth daily.    . rizatriptan (MAXALT) 10 MG tablet Take 1 tablet (10 mg total) by mouth as needed for migraine. May repeat in 2 hours if needed 10 tablet 5   No facility-administered medications prior to visit.    Allergies  Allergen Reactions  . Sulfa Antibiotics Nausea And Vomiting  .  Cinnamon     "eating too much causes lip swelling"  . Codeine Nausea Only  . Corn-Containing Products Other (See Comments)    Throat swelling  . Spinach Swelling    If 'eats too much gets lip swelling'  . Sulfacarbamide Other (See Comments)  . Wheat     "eating too much causes lip swelling"    Review of Systems  Constitutional:  Negative for chills, diaphoresis, fatigue and fever.  HENT:  Negative for congestion, ear pain, sinus pain and sore throat.   Eyes: Negative.   Respiratory:  Negative for cough, chest tightness, shortness of breath and wheezing.   Cardiovascular:  Negative for chest pain and palpitations.  Gastrointestinal:  Negative for abdominal pain, constipation, diarrhea, nausea and vomiting.  Endocrine: Negative.   Genitourinary:  Negative for dysuria, frequency and urgency.  Musculoskeletal:  Negative for arthralgias.  Skin:  Positive for rash.       Possible cyst under rash on the left outer thigh area  Allergic/Immunologic: Negative.   Neurological:  Negative for weakness and headaches.  Hematological: Negative.   Psychiatric/Behavioral:  Negative for dysphoric mood. The patient is not nervous/anxious.        Objective:        09/15/2023    4:06 PM 07/21/2023    1:08 PM 05/28/2023    8:18 AM  Vitals with BMI  Height 5\' 10"  5\' 10"  5\' 10"   Weight 320 lbs 3 oz 336 lbs 333 lbs  BMI 45.94 48.21  47.78  Systolic 136 134 161  Diastolic 70 82 82  Pulse 85      No data found.   Physical Exam Constitutional:      General: She is not in acute distress.    Appearance: Normal appearance. She is not ill-appearing.  Eyes:     Conjunctiva/sclera: Conjunctivae normal.  Cardiovascular:     Rate and Rhythm: Normal rate and regular rhythm.     Heart sounds: Normal heart sounds. No murmur heard. Pulmonary:     Effort: Pulmonary effort is normal.     Breath sounds: Normal breath sounds. No wheezing.  Abdominal:     General: Bowel sounds are normal.     Palpations: Abdomen is soft.     Tenderness: There is no abdominal tenderness.  Musculoskeletal:        General: Normal range of motion.  Skin:    General: Skin is warm.     Findings: Erythema and rash present. Rash is crusting and scaling.  Neurological:     Mental Status: She is alert. Mental status is at baseline.  Psychiatric:        Mood and Affect: Mood normal.        Behavior: Behavior normal.      Health Maintenance Due  Topic Date Due  . Pneumococcal Vaccine 40-50 Years old (1 of 2 - PCV) Never done  . Zoster Vaccines- Shingrix (1 of 2) Never done  . MAMMOGRAM  06/25/2022  . Cervical Cancer Screening (HPV/Pap Cotest)  06/26/2023    There are no preventive care reminders to display for this patient.   Lab Results  Component Value Date   TSH 3.85 05/28/2023   Lab Results  Component Value Date   WBC 5.5 05/28/2023   HGB 14.5 05/28/2023   HCT 43.5 05/28/2023   MCV 96.2 05/28/2023   PLT 258 05/28/2023   Lab Results  Component Value Date   NA 140 05/28/2023   K 4.2 05/28/2023  CO2 24 05/28/2023   GLUCOSE 128 (H) 05/28/2023   BUN 12 05/28/2023   CREATININE 0.78 05/28/2023   BILITOT 0.6 05/28/2023   AST 14 05/28/2023   ALT 18 05/28/2023   PROT 6.6 05/28/2023   CALCIUM 9.0 05/28/2023   ANIONGAP 9 02/05/2023   EGFR 88 05/28/2023   Lab Results  Component Value Date   CHOL 278 (H) 05/28/2023   Lab  Results  Component Value Date   HDL 46 (L) 05/28/2023   Lab Results  Component Value Date   LDLCALC 203 (H) 05/28/2023   Lab Results  Component Value Date   TRIG 138 05/28/2023   Lab Results  Component Value Date   CHOLHDL 6.0 (H) 05/28/2023   Lab Results  Component Value Date   HGBA1C 5.9 (H) 05/28/2023       Assessment & Plan:  Rash  Mass of subcutaneous tissue Assessment & Plan: Non-tender subcutaneous lump, likely benign. Differential includes hematoma or benign lesion. Observation recommended. - Monitor for changes in size, pain, or if it spreads to other areas - Consider punch biopsy if changes occur. - Advise heat or ice and compression for comfort. - Educated on signs of complications requiring further evaluation.   Pure hypercholesterolemia Assessment & Plan: Managed with red yeast rice and omega-3 fish oil due to statin intolerance. Uninsured, limiting access to medications. Lab Results  Component Value Date   LDLCALC 203 (H) 05/28/2023    - Continue current regimen. - Discuss alternative non-statin medications when insurance is available. - Provide information on alternatives via MyChart when available. - Fu next month for physical and labs   Prediabetes Assessment & Plan: HbA1c 5.9%. Managed through lifestyle modifications. - Continue lifestyle modifications. - Monitor blood glucose levels regularly.   GAD (generalized anxiety disorder) Assessment & Plan: Improved with recent life changes. Non-pharmacological strategies encouraged. - Encourage non-pharmacological coping strategies. - Monitor for worsening symptoms.      Assessment and Plan       No orders of the defined types were placed in this encounter.   No orders of the defined types were placed in this encounter.    Follow-up: Return in about 2 months (around 11/15/2023) for lab visit.  An After Visit Summary was printed and given to the patient.  Renne Crigler, FNP [facility] [phone]

## 2023-09-15 NOTE — Assessment & Plan Note (Signed)
 HbA1c 5.9%. Managed through lifestyle modifications. - Continue lifestyle modifications. - Monitor blood glucose levels regularly.

## 2023-09-15 NOTE — Assessment & Plan Note (Addendum)
 Managed with red yeast rice and omega-3 fish oil due to statin intolerance. Uninsured, limiting access to medications. Lab Results  Component Value Date   LDLCALC 203 (H) 05/28/2023    - Continue current regimen. - Discuss alternative non-statin medications when insurance is available. - Provide information on alternatives via MyChart when available. - Fu next month for physical and labs

## 2023-10-07 ENCOUNTER — Encounter: Payer: Self-pay | Admitting: Family Medicine

## 2023-11-25 ENCOUNTER — Ambulatory Visit: Payer: Self-pay | Admitting: Internal Medicine

## 2024-05-23 ENCOUNTER — Ambulatory Visit (INDEPENDENT_AMBULATORY_CARE_PROVIDER_SITE_OTHER): Payer: Self-pay | Admitting: Radiology

## 2024-05-23 ENCOUNTER — Encounter (HOSPITAL_BASED_OUTPATIENT_CLINIC_OR_DEPARTMENT_OTHER): Payer: Self-pay

## 2024-05-23 ENCOUNTER — Ambulatory Visit (HOSPITAL_BASED_OUTPATIENT_CLINIC_OR_DEPARTMENT_OTHER)
Admission: RE | Admit: 2024-05-23 | Discharge: 2024-05-23 | Disposition: A | Discharge status: Home / Self Care | Payer: Self-pay | Attending: Family Medicine | Admitting: Family Medicine

## 2024-05-23 VITALS — BP 138/87 | HR 92 | Temp 98.2°F | Resp 18

## 2024-05-23 DIAGNOSIS — J4520 Mild intermittent asthma, uncomplicated: Secondary | ICD-10-CM

## 2024-05-23 DIAGNOSIS — R051 Acute cough: Secondary | ICD-10-CM

## 2024-05-23 MED ORDER — PREDNISONE 20 MG PO TABS: 40.0000 mg | ORAL_TABLET | Freq: Every day | ORAL | 0 refills | Status: AC

## 2024-05-23 MED ORDER — ALBUTEROL SULFATE (2.5 MG/3ML) 0.083% IN NEBU
2.5000 mg | INHALATION_SOLUTION | Freq: Four times a day (QID) | RESPIRATORY_TRACT | 1 refills | Status: AC | PRN
Start: 2024-05-23 — End: ?

## 2024-05-23 NOTE — ED Provider Notes (Signed)
 [facility]    CSN: [id number] Arrival date & time: 05/23/24  1421      History   Chief Complaint Chief Complaint  Patient presents with   Wheezing    Possibly Bronchitis - Entered by patient    HPI Debra Gonzalez is a 58 y.o. female.   Pt is a 58 year old female that  presents today with cough, wheezing, nasal congestion.  This has been off and on for 2 weeks.  She was initially treated with antibiotics and did get better but then the cough and wheezing came back.  She has a history of asthma and reactive airway disease. This is seasonal for her. Pt reports  taking tylenol, Flonase, robitussin, nebulizer treatment.  She has no fever.   Wheezing   Past Medical History:  Diagnosis Date   Asthma    Hyperlipidemia    Reactive airway disease     Patient Active Problem List   Diagnosis Date Noted   Rash 09/15/2023   Mass of subcutaneous tissue 09/15/2023   GAD (generalized anxiety disorder) 09/15/2023   Prediabetes 05/28/2023   Acquired hypothyroidism 02/09/2023   Migraine 02/05/2023   Class 3 severe obesity due to excess calories with body mass index (BMI) of 45.0 to 49.9 in adult [facility]) 11/03/2022   Asthma 12/12/2019    Past Surgical History:  Procedure Laterality Date   CHOLECYSTECTOMY     TONSILLECTOMY      OB History   No obstetric history on file.      Home Medications    Prior to Admission medications   Medication Sig Start Date End Date Taking? Authorizing Provider  albuterol  (VENTOLIN  HFA) 108 (90 Base) MCG/ACT inhaler Inhale 2 puffs into the lungs every 6 (six) hours as needed for wheezing or shortness of breath. 11/03/22  Yes Baity, Angeline ORN, NP  Cholecalciferol (VITAMIN D-3) 5000 UNIT/ML LIQD Place under the tongue.   Yes [provider]  fluticasone (FLONASE) 50 MCG/ACT nasal spray Place 2 sprays into both nostrils daily.   Yes [provider]  levothyroxine  (SYNTHROID ) 25 MCG tablet Take 1 tablet (25 mcg total) by mouth  daily. 05/29/23  Yes Antonette Angeline ORN, NP  Magnesium 100 MG TABS Take by mouth.   Yes [provider]  Multiple Vitamin (MULTIVITAMIN) tablet Take 1 tablet by mouth daily.   Yes [provider]  predniSONE  (DELTASONE ) 20 MG tablet Take 2 tablets (40 mg total) by mouth daily with breakfast for 5 days. 05/23/24 05/28/24 Yes Rylie Limburg A, FNP  acetaminophen (TYLENOL) 500 MG tablet Take 500 mg by mouth every 6 (six) hours as needed.    [provider]  albuterol  (PROVENTIL ) (2.5 MG/3ML) 0.083% nebulizer solution Take 3 mLs (2.5 mg total) by nebulization every 6 (six) hours as needed for wheezing or shortness of breath. 05/23/24   Adah Corning A, FNP  b complex vitamins tablet Take 1 tablet by mouth daily.    [provider]  Biotin 1000 MCG tablet Take 1,000 mcg by mouth 3 (three) times daily.    [provider]  ibuprofen (ADVIL) 200 MG tablet Take 200 mg by mouth every 6 (six) hours as needed.    [provider]  rizatriptan  (MAXALT ) 10 MG tablet Take 1 tablet (10 mg total) by mouth as needed for migraine. May repeat in 2 hours if needed 07/21/23   Antonette Angeline ORN, NP    Family History Family History  Problem Relation Age of Onset   Mitral valve  prolapse Mother    Depression Mother    Hyperlipidemia Mother    Hypertension Mother    Melanoma Father    Hyperlipidemia Maternal Grandmother    Hypertension Maternal Grandmother    Diabetes Maternal Grandfather    Hyperlipidemia Paternal Grandmother    Stroke Paternal Grandmother    Bone cancer Paternal Grandfather     Social History Social History   Tobacco Use   Smoking status: Never   Smokeless tobacco: Never  Vaping Use   Vaping status: Never Used  Substance Use Topics   Alcohol use: Yes    Alcohol/week: 1.0 standard drink of alcohol    Types: 1 Glasses of wine per week    Comment: weekly   Drug use: No     Allergies   Sulfa antibiotics, Cinnamon, Codeine, Corn-containing  products, Spinach, Sulfacarbamide, and Wheat   Review of Systems Review of Systems  Respiratory:  Positive for wheezing.    See HPI  Physical Exam Triage Vital Signs ED Triage Vitals  Encounter Vitals Group     BP 05/23/24 1441 138/87     Girls Systolic BP Percentile --      Girls Diastolic BP Percentile --      Boys Systolic BP Percentile --      Boys Diastolic BP Percentile --      Pulse Rate 05/23/24 1441 92     Resp 05/23/24 1441 18     Temp 05/23/24 1441 98.2 F (36.8 C)     Temp Source 05/23/24 1441 Oral     SpO2 05/23/24 1441 98 %     Weight --      Height --      Head Circumference --      Peak Flow --      Pain Score 05/23/24 1438 0     Pain Loc --      Pain Education --      Exclude from Growth Chart --    No data found.  Updated Vital Signs BP 138/87 (BP Location: Right Arm)   Pulse 92   Temp 98.2 F (36.8 C) (Oral)   Resp 18   LMP  (LMP Unknown)   SpO2 98%   Visual Acuity Right Eye Distance:   Left Eye Distance:   Bilateral Distance:    Right Eye Near:   Left Eye Near:    Bilateral Near:     Physical Exam Vitals and nursing note reviewed.  Constitutional:      General: She is not in acute distress.    Appearance: Normal appearance. She is not ill-appearing, toxic-appearing or diaphoretic.  Cardiovascular:     Rate and Rhythm: Normal rate and regular rhythm.     Heart sounds: Normal heart sounds.  Pulmonary:     Effort: Pulmonary effort is normal.     Breath sounds: Wheezing present.     Comments: Fine expiratory wheeze in bases of lungs  Musculoskeletal:        General: Normal range of motion.  Neurological:     Mental Status: She is alert.  Psychiatric:        Mood and Affect: Mood normal.      UC Treatments / Results  Labs (all labs ordered are listed, but only abnormal results are displayed) Labs Reviewed - No data to display  EKG   Radiology DG Chest 2 View Result Date: 05/23/2024 CLINICAL DATA:  cough EXAM: CHEST -  2 VIEW COMPARISON:  02/05/2023 FINDINGS: Stable heart size and  vascularity. Similar basilar scarring. No new focal pneumonia, collapse or consolidation. Negative for edema, effusion, or pneumothorax. Trachea midline. No acute osseous finding. IMPRESSION: Stable chest exam. No interval change or acute process by plain radiography. Electronically Signed   By: CHRISTELLA.  Shick M.D.   On: 05/23/2024 16:11    Procedures Procedures (including critical care time)  Medications Ordered in UC Medications - No data to display  Initial Impression / Assessment and Plan / UC Course  I have reviewed the triage vital signs and the nursing notes.  Pertinent labs & imaging results that were available during my care of the patient were reviewed by me and considered in my medical decision making (see chart for details).     Acute cough- X ray did not show any pneumonia. This may be more inflammation. Acute bronchitis. Could be allergy induced. Make sure that you are taking Flonase daily. Recommend Claritin based on reaction to Zyrtec. I am prescribing another round of prednisone . You may need to talk to your doctor about starting the steroid inhaler again. Use the inhaler and nebulizer as needed.  Final Clinical Impressions(s) / UC Diagnoses   Final diagnoses:  Acute cough     Discharge Instructions      X ray did not show any pneumonia. This may be more inflammation. Acute bronchitis. Could be allergy induced. Make sure that you are taking Flonase daily. I am prescribing another round of prednisone . You may need to talk to your doctor about starting the steroid inhaler again. Use the inhaler and nebulizer as needed.     ED Prescriptions     Medication Sig Dispense Auth. Provider   predniSONE  (DELTASONE ) 20 MG tablet Take 2 tablets (40 mg total) by mouth daily with breakfast for 5 days. 10 tablet Nature Kueker A, FNP   albuterol  (PROVENTIL ) (2.5 MG/3ML) 0.083% nebulizer solution Take 3 mLs (2.5 mg total) by  nebulization every 6 (six) hours as needed for wheezing or shortness of breath. 150 mL Adah Corning A, FNP      PDMP not reviewed this encounter.   Adah Corning LABOR, FNP 05/23/24 [phone]

## 2024-05-23 NOTE — Discharge Instructions (Addendum)
 X ray did not show any pneumonia. This may be more inflammation. Acute bronchitis. Could be allergy induced. Make sure that you are taking Flonase daily. I am prescribing another round of prednisone . You may need to talk to your doctor about starting the steroid inhaler again. Use the inhaler and nebulizer as needed.

## 2024-05-23 NOTE — ED Triage Notes (Addendum)
 Pt c/o cough, wheezing, nasal congestion, fever 101 x 2 weeks. Pt reports  taking tylenol, Flonase, robitussin,Albuterol  breathing treatments. Pt was seen 10/21 at internal medicine for upper respiratory infection completed antibiotics.Pt has a hx of asthma.
